# Patient Record
Sex: Male | Born: 1970
Health system: Southern US, Community
[De-identification: ages and names within clinical notes are randomized; demographics above are authoritative.]

## PROBLEM LIST (undated history)

## (undated) DIAGNOSIS — E7800 Pure hypercholesterolemia, unspecified: Secondary | ICD-10-CM

## (undated) DIAGNOSIS — I10 Essential (primary) hypertension: Secondary | ICD-10-CM

## (undated) HISTORY — PX: NO PAST SURGERIES: SHX2092

---

## 2001-09-15 ENCOUNTER — Emergency Department (HOSPITAL_COMMUNITY): Admission: EM | Admit: 2001-09-15 | Discharge: 2001-09-16 | Payer: Self-pay | Admitting: Emergency Medicine

## 2001-09-16 ENCOUNTER — Encounter: Payer: Self-pay | Admitting: Emergency Medicine

## 2003-03-04 ENCOUNTER — Emergency Department (HOSPITAL_COMMUNITY): Admission: EM | Admit: 2003-03-04 | Discharge: 2003-03-04 | Payer: Self-pay | Admitting: Emergency Medicine

## 2007-08-20 ENCOUNTER — Emergency Department (HOSPITAL_COMMUNITY): Admission: EM | Admit: 2007-08-20 | Discharge: 2007-08-20 | Payer: Self-pay | Admitting: Emergency Medicine

## 2011-11-30 ENCOUNTER — Other Ambulatory Visit (HOSPITAL_COMMUNITY)
Admission: RE | Admit: 2011-11-30 | Discharge: 2011-11-30 | Disposition: A | Discharge status: Home / Self Care | Payer: Managed Care, Other (non HMO) | Source: Ambulatory Visit | Attending: Family Medicine | Admitting: Family Medicine

## 2011-11-30 ENCOUNTER — Ambulatory Visit (INDEPENDENT_AMBULATORY_CARE_PROVIDER_SITE_OTHER): Payer: Managed Care, Other (non HMO) | Admitting: Family Medicine

## 2011-11-30 ENCOUNTER — Encounter: Payer: Self-pay | Admitting: Family Medicine

## 2011-11-30 VITALS — BP 142/80 | HR 68 | Resp 18 | Ht 69.0 in | Wt 181.0 lb

## 2011-11-30 DIAGNOSIS — Z113 Encounter for screening for infections with a predominantly sexual mode of transmission: Secondary | ICD-10-CM | POA: Insufficient documentation

## 2011-11-30 DIAGNOSIS — IMO0001 Reserved for inherently not codable concepts without codable children: Secondary | ICD-10-CM

## 2011-11-30 DIAGNOSIS — F17200 Nicotine dependence, unspecified, uncomplicated: Secondary | ICD-10-CM

## 2011-11-30 DIAGNOSIS — R030 Elevated blood-pressure reading, without diagnosis of hypertension: Secondary | ICD-10-CM

## 2011-11-30 DIAGNOSIS — Z720 Tobacco use: Secondary | ICD-10-CM

## 2011-11-30 DIAGNOSIS — N50819 Testicular pain, unspecified: Secondary | ICD-10-CM | POA: Insufficient documentation

## 2011-11-30 DIAGNOSIS — N509 Disorder of male genital organs, unspecified: Secondary | ICD-10-CM

## 2011-11-30 DIAGNOSIS — Z13220 Encounter for screening for lipoid disorders: Secondary | ICD-10-CM

## 2011-11-30 DIAGNOSIS — Z833 Family history of diabetes mellitus: Secondary | ICD-10-CM

## 2011-11-30 LAB — CBC
HCT: 43 % (ref 39.0–52.0)
Hemoglobin: 14.5 g/dL (ref 13.0–17.0)
MCH: 28.1 pg (ref 26.0–34.0)
MCHC: 33.7 g/dL (ref 30.0–36.0)
MCV: 83.3 fL (ref 78.0–100.0)
Platelets: 224 10*3/uL (ref 150–400)
RBC: 5.16 MIL/uL (ref 4.22–5.81)
RDW: 12.4 % (ref 11.5–15.5)
WBC: 4.1 10*3/uL (ref 4.0–10.5)

## 2011-11-30 LAB — COMPREHENSIVE METABOLIC PANEL
ALT: 15 U/L (ref 0–53)
AST: 18 U/L (ref 0–37)
Albumin: 4.5 g/dL (ref 3.5–5.2)
Alkaline Phosphatase: 60 U/L (ref 39–117)
BUN: 15 mg/dL (ref 6–23)
CO2: 31 mEq/L (ref 19–32)
Calcium: 9.5 mg/dL (ref 8.4–10.5)
Chloride: 104 mEq/L (ref 96–112)
Creat: 1.05 mg/dL (ref 0.50–1.35)
Glucose, Bld: 100 mg/dL — ABNORMAL HIGH (ref 70–99)
Potassium: 4.4 mEq/L (ref 3.5–5.3)
Sodium: 142 mEq/L (ref 135–145)
Total Bilirubin: 0.4 mg/dL (ref 0.3–1.2)
Total Protein: 7.3 g/dL (ref 6.0–8.3)

## 2011-11-30 LAB — POCT URINALYSIS DIPSTICK
Bilirubin, UA: NEGATIVE
Blood, UA: NEGATIVE
Glucose, UA: NEGATIVE
Ketones, UA: NEGATIVE
Nitrite, UA: NEGATIVE
Protein, UA: NEGATIVE
Spec Grav, UA: >=1.03
Urobilinogen, UA: 0.2
pH, UA: 6

## 2011-11-30 LAB — LIPID PANEL
Cholesterol: 216 mg/dL — ABNORMAL HIGH (ref 0–200)
HDL: 54 mg/dL (ref >39)
LDL Cholesterol: 136 mg/dL — ABNORMAL HIGH (ref 0–99)
Total CHOL/HDL Ratio: 4 Ratio
Triglycerides: 132 mg/dL (ref <150)
VLDL: 26 mg/dL (ref 0–40)

## 2011-11-30 MED ORDER — DOXYCYCLINE HYCLATE 100 MG PO TABS: 100.0000 mg | ORAL_TABLET | Freq: Two times a day (BID) | ORAL | Status: AC

## 2011-11-30 MED ORDER — CEFTRIAXONE SODIUM 1 G IJ SOLR
250.0000 mg | Freq: Once | INTRAMUSCULAR | Status: AC
  Administered 2011-11-30: 250 mg via INTRAMUSCULAR

## 2011-11-30 NOTE — Patient Instructions (Signed)
Get the labs done today  Start the antibiotics- take 1 tablet twice a day F/U 4 weeks

## 2011-11-30 NOTE — Progress Notes (Signed)
  Subjective:    Patient ID: Xavier Bauer, male    DOB: 1970-10-11, 41 y.o.   MRN: [id number]  HPI  Pt here to establish care, no previous PCP  History reviewed, no medication Concern today for recurrent scrotal pain. He's had pain on and off in his left testicle for the past 4 months. He is divorced and has no current partner. He had this previously 3 years ago was treated with antibiotics and resolved. He denies any burning sensation with urination denies blood in the urine denies abdominal pain. He denies penile discharge.   Review of Systems   GEN- denies fatigue, fever, weight loss,weakness, recent illness HEENT- denies eye drainage, change in vision, nasal discharge, CVS- denies chest pain, palpitations RESP- denies SOB, cough, wheeze ABD- denies N/V, change in stools, abd pain GU- denies dysuria, hematuria, dribbling, incontinence MSK- denies joint pain, muscle aches, injury Neuro- denies headache, dizziness, syncope, seizure activity      Objective:   Physical Exam GEN- NAD, alert and oriented x3 HEENT- PERRL, EOMI, non injected sclera, pink conjunctiva, MMM, oropharynx clear Neck- Supple, no thryomegaly CVS- RRR, no murmur RESP-CTAB ABD-NABS,soft,NT,ND GU- testes descened bilat, no hernia, no swelling, TTP left posterior testes , no nodules felt  EXT- No edema Pulses- Radial, DP- 2+        Assessment & Plan:

## 2011-12-01 LAB — HEMOGLOBIN A1C
Hgb A1c MFr Bld: 5.1 % (ref <5.7)
Mean Plasma Glucose: 100 mg/dL (ref <117)

## 2011-12-02 LAB — URINE CULTURE
Colony Count: NO GROWTH
Organism ID, Bacteria: NO GROWTH

## 2011-12-03 NOTE — Assessment & Plan Note (Signed)
Counseled on smoking cessation  

## 2011-12-03 NOTE — Assessment & Plan Note (Signed)
Will monitor blood pressure, no medications to be started at this visit

## 2011-12-03 NOTE — Assessment & Plan Note (Addendum)
We'll treat for possible epididymitis. Urine culture and urinalysis obtain urine GC will also be done. He was given a shot of Rocephin and will be started on doxycycline. If this does not improve I will obtain ultrasound of the testes

## 2012-03-31 ENCOUNTER — Other Ambulatory Visit (HOSPITAL_COMMUNITY)
Admission: RE | Admit: 2012-03-31 | Discharge: 2012-03-31 | Disposition: A | Discharge status: Home / Self Care | Payer: Managed Care, Other (non HMO) | Source: Ambulatory Visit | Attending: Family Medicine | Admitting: Family Medicine

## 2012-03-31 ENCOUNTER — Encounter: Payer: Self-pay | Admitting: Family Medicine

## 2012-03-31 ENCOUNTER — Other Ambulatory Visit: Payer: Self-pay | Admitting: Family Medicine

## 2012-03-31 ENCOUNTER — Ambulatory Visit (INDEPENDENT_AMBULATORY_CARE_PROVIDER_SITE_OTHER): Payer: Managed Care, Other (non HMO) | Admitting: Family Medicine

## 2012-03-31 VITALS — BP 140/84 | HR 86 | Resp 18 | Ht 69.0 in | Wt 185.1 lb

## 2012-03-31 DIAGNOSIS — Z113 Encounter for screening for infections with a predominantly sexual mode of transmission: Secondary | ICD-10-CM | POA: Insufficient documentation

## 2012-03-31 DIAGNOSIS — IMO0001 Reserved for inherently not codable concepts without codable children: Secondary | ICD-10-CM

## 2012-03-31 DIAGNOSIS — E785 Hyperlipidemia, unspecified: Secondary | ICD-10-CM

## 2012-03-31 DIAGNOSIS — Z2089 Contact with and (suspected) exposure to other communicable diseases: Secondary | ICD-10-CM

## 2012-03-31 DIAGNOSIS — Z202 Contact with and (suspected) exposure to infections with a predominantly sexual mode of transmission: Secondary | ICD-10-CM | POA: Insufficient documentation

## 2012-03-31 DIAGNOSIS — R030 Elevated blood-pressure reading, without diagnosis of hypertension: Secondary | ICD-10-CM

## 2012-03-31 LAB — RPR

## 2012-03-31 LAB — HIV ANTIBODY (ROUTINE TESTING W REFLEX): HIV: NONREACTIVE

## 2012-03-31 LAB — HEPATITIS B SURFACE ANTIGEN: Hepatitis B Surface Ag: NEGATIVE

## 2012-03-31 NOTE — Progress Notes (Signed)
  Subjective:    Patient ID: Xavier Bauer, male    DOB: 03/22/1971, 41 y.o.   MRN: [id number]  HPI  Patient here secondary to exposure to STD. He was told that his partner has been exposed to herpes simplex type II, and he would like full STD check. He denies any rash ulcers or lesions on the genital region. He states he did have a cold sore a few weeks ago which is down scabbed over. His previous testicular pain or heat he was treated for an office has resolved. His previous labs were also reviewed at this visit  Review of Systems - per above   GEN- denies fatigue, fever, weight loss,weakness, recent illness HEENT- denies eye drainage, change in vision, nasal discharge, CVS- denies chest pain, palpitations RESP- denies SOB, cough, wheeze ABD- denies N/V, change in stools, abd pain GU- denies dysuria, hematuria, dribbling, incontinence Skin- no rash       Objective:   Physical Exam GEN- NAD, alert and oriented x3 HEENT- PERRL, EOMI, non injected sclera, pink conjunctiva, MMM, oropharynx clear, scab lower left lip Neck- Supple, no LAD CVS- RRR, no murmur RESP-CTAB EXT- No edema Pulses- Radial 2+        Assessment & Plan:

## 2012-03-31 NOTE — Assessment & Plan Note (Signed)
Work on diet and lifestyle changes, pt committed to doing so

## 2012-03-31 NOTE — Assessment & Plan Note (Signed)
Urine GC/Chlamydia, HSV , HIV, RPR, HepB to be done

## 2012-03-31 NOTE — Assessment & Plan Note (Signed)
Monitor BP, discussed dietary changes

## 2012-03-31 NOTE — Patient Instructions (Signed)
We will call with results  Watch the salt intake, watch the fried foods, low cholesterol F/U 6 months for physical

## 2012-04-02 ENCOUNTER — Telehealth: Payer: Self-pay | Admitting: Family Medicine

## 2012-04-02 NOTE — Telephone Encounter (Signed)
noted 

## 2012-04-02 NOTE — Telephone Encounter (Signed)
Labs pending.  HSV 1 and HSV 2 not collected.  Called and added additional labs.

## 2012-04-03 LAB — HSV(HERPES SIMPLEX VRS) I + II AB-IGG: HSV 1 Glycoprotein G Ab, IgG: 11.11 IV — ABNORMAL HIGH

## 2014-07-04 ENCOUNTER — Emergency Department (HOSPITAL_BASED_OUTPATIENT_CLINIC_OR_DEPARTMENT_OTHER)
Admission: EM | Admit: 2014-07-04 | Discharge: 2014-07-04 | Disposition: A | Discharge status: Home / Self Care | Payer: Managed Care, Other (non HMO) | Attending: Emergency Medicine | Admitting: Emergency Medicine

## 2014-07-04 ENCOUNTER — Encounter (HOSPITAL_BASED_OUTPATIENT_CLINIC_OR_DEPARTMENT_OTHER): Payer: Self-pay | Admitting: Emergency Medicine

## 2014-07-04 DIAGNOSIS — Z720 Tobacco use: Secondary | ICD-10-CM | POA: Insufficient documentation

## 2014-07-04 DIAGNOSIS — J111 Influenza due to unidentified influenza virus with other respiratory manifestations: Secondary | ICD-10-CM | POA: Insufficient documentation

## 2014-07-04 DIAGNOSIS — R69 Illness, unspecified: Secondary | ICD-10-CM

## 2014-07-04 DIAGNOSIS — R509 Fever, unspecified: Secondary | ICD-10-CM | POA: Diagnosis present

## 2014-07-04 LAB — CBG MONITORING, ED: GLUCOSE-CAPILLARY: 127 mg/dL — AB (ref 70–99)

## 2014-07-04 MED ORDER — ACETAMINOPHEN 325 MG PO TABS
650.0000 mg | ORAL_TABLET | Freq: Once | ORAL | Status: AC
  Administered 2014-07-04: 650 mg via ORAL
  Filled 2014-07-04: qty 2

## 2014-07-04 NOTE — ED Notes (Signed)
Pt states body aches and fever starting yesterday morning, sore throat; fever and chills throughout night. Nauseated but denies V/D. Has been taking Alkaseltzer Cold at home with some relief but still feels bad.

## 2014-07-04 NOTE — Discharge Instructions (Signed)

## 2014-07-04 NOTE — ED Provider Notes (Signed)
CSN: [id number]     Arrival date & time 07/04/14  1434 History  This chart was scribed for Margarita Grizzle, MD by Abel Presto, ED Scribe. This patient was seen in room MH06/MH06 and the patient's care was started at 4:13 PM.    Chief Complaint  Patient presents with  . Fever     Patient is a 44 y.o. male presenting with fever. The history is provided by the patient. No language interpreter was used.  Fever Temp source:  Subjective Severity:  Mild Onset quality:  Sudden Duration:  2 days Timing:  Constant Progression:  Unchanged Chronicity:  New Relieved by:  Nothing Worsened by:  Nothing tried Associated symptoms: chills, cough, nausea and sore throat   Associated symptoms: no diarrhea and no vomiting   Cough:    Cough characteristics:  Non-productive   Severity:  Mild   Onset quality:  Sudden   Duration:  2 days   Timing:  Sporadic   Progression:  Unchanged   Chronicity:  New Nausea:    Severity:  Unable to specify Sore throat:    Severity:  Moderate   Onset quality:  Sudden   Duration:  2 weeks   Timing:  Constant   Progression:  Unchanged Risk factors: no sick contacts    HPI Comments: Xavier Bauer is a 44 y.o. male who presents to the Emergency Department complaining of subjective fever with onset yesterday. Pt notes associated sore throat, SOB, non productive cough chills, and nausea. Pt has taken Alkaseltzer Cold for relief. Pt denies vomiting, diarrhea, and vision changes. Pt is an occasional smoker. Pt denies recent sick contacts and did not get a flu shot this year.   History reviewed. No pertinent past medical history. History reviewed. No pertinent past surgical history. Family History  Problem Relation Age of Onset  . Hypertension Mother   . Hyperlipidemia Mother   . Hypertension Father   . Hyperlipidemia Father   . Diabetes Father   . Hypertension Brother    History  Substance Use Topics  . Smoking status: Current Some Day Smoker -- 0.00  packs/day    Types: Cigarettes  . Smokeless tobacco: Not on file  . Alcohol Use: Yes     Comment: last drink 3 weeks ago    Review of Systems  Constitutional: Positive for fever and chills.  HENT: Positive for sore throat.   Eyes: Negative for visual disturbance.  Respiratory: Positive for cough and shortness of breath.   Gastrointestinal: Positive for nausea. Negative for vomiting and diarrhea.  All other systems reviewed and are negative.     Allergies  Review of patient's allergies indicates no known allergies.  Home Medications   Prior to Admission medications   Not on File   BP 147/95 mmHg  Pulse 111  Temp(Src) 99.6 F (37.6 C) (Oral)  Resp 18  Ht  (1.753 m)  Wt 160 lb (72.576 kg)  BMI 23.62 kg/m2  SpO2 99% Physical Exam  Constitutional: He is oriented to person, place, and time. He appears well-developed and well-nourished.  HENT:  Head: Normocephalic.  Eyes: Conjunctivae are normal.  Neck: Normal range of motion. Neck supple.  Cardiovascular: Normal rate, regular rhythm and normal heart sounds.  Exam reveals no friction rub.   No murmur heard. Pulmonary/Chest: Effort normal and breath sounds normal. No respiratory distress. He has no wheezes. He has no rales.  Abdominal: Soft. There is no tenderness.  Musculoskeletal: Normal range of motion.  Neurological: He is  alert and oriented to person, place, and time.  Skin: Skin is warm and dry.  Psychiatric: He has a normal mood and affect. His behavior is normal.  Nursing note and vitals reviewed.   ED Course  Procedures (including critical care time) DIAGNOSTIC STUDIES: Oxygen Saturation is 99% on room air, normal by my interpretation.    COORDINATION OF CARE: 4:18 PM Discussed treatment plan with patient at beside, the patient agrees with the plan and has no further questions at this time.   Labs Review Labs Reviewed  CBG MONITORING, ED - Abnormal; Notable for the following:    Glucose-Capillary  127 (*)    All other components within normal limits  CBG MONITORING, ED    Imaging Review No results found.   EKG Interpretation None      MDM   Final diagnoses:  Influenza-like illness     I personally performed the services described in this documentation, which was scribed in my presence. The recorded information has been reviewed and considered.      Margarita Grizzleanielle Catori Panozzo, MD 07/07/14 [phone]

## 2016-09-07 ENCOUNTER — Ambulatory Visit (INDEPENDENT_AMBULATORY_CARE_PROVIDER_SITE_OTHER): Payer: 59 | Admitting: Urology

## 2016-09-07 DIAGNOSIS — Z302 Encounter for sterilization: Secondary | ICD-10-CM | POA: Diagnosis not present

## 2017-03-09 ENCOUNTER — Encounter (HOSPITAL_COMMUNITY): Payer: Self-pay | Admitting: Emergency Medicine

## 2017-03-09 ENCOUNTER — Emergency Department (HOSPITAL_COMMUNITY)
Admission: EM | Admit: 2017-03-09 | Discharge: 2017-03-09 | Disposition: A | Discharge status: Home / Self Care | Payer: 59 | Attending: Emergency Medicine | Admitting: Emergency Medicine

## 2017-03-09 ENCOUNTER — Other Ambulatory Visit: Payer: Self-pay

## 2017-03-09 ENCOUNTER — Emergency Department (HOSPITAL_COMMUNITY): Payer: 59

## 2017-03-09 DIAGNOSIS — R1084 Generalized abdominal pain: Secondary | ICD-10-CM | POA: Insufficient documentation

## 2017-03-09 DIAGNOSIS — M545 Low back pain, unspecified: Secondary | ICD-10-CM

## 2017-03-09 DIAGNOSIS — R1030 Lower abdominal pain, unspecified: Secondary | ICD-10-CM | POA: Diagnosis not present

## 2017-03-09 DIAGNOSIS — F17210 Nicotine dependence, cigarettes, uncomplicated: Secondary | ICD-10-CM | POA: Diagnosis not present

## 2017-03-09 LAB — CBC WITH DIFFERENTIAL/PLATELET
Basophils Absolute: 0 10*3/uL (ref 0.0–0.1)
Basophils Relative: 1 %
EOS ABS: 0.1 10*3/uL (ref 0.0–0.7)
Eosinophils Relative: 3 %
HEMATOCRIT: 49.1 % (ref 39.0–52.0)
Hemoglobin: 15.5 g/dL (ref 13.0–17.0)
LYMPHS ABS: 1.4 10*3/uL (ref 0.7–4.0)
Lymphocytes Relative: 33 %
MCH: 28.3 pg (ref 26.0–34.0)
MCHC: 31.6 g/dL (ref 30.0–36.0)
MCV: 89.8 fL (ref 78.0–100.0)
MONOS PCT: 8 %
Monocytes Absolute: 0.4 10*3/uL (ref 0.1–1.0)
Neutro Abs: 2.3 10*3/uL (ref 1.7–7.7)
Neutrophils Relative %: 55 %
Platelets: 230 10*3/uL (ref 150–400)
RBC: 5.47 MIL/uL (ref 4.22–5.81)
RDW: 12.4 % (ref 11.5–15.5)
WBC: 4.2 10*3/uL (ref 4.0–10.5)

## 2017-03-09 LAB — COMPREHENSIVE METABOLIC PANEL
ALK PHOS: 62 U/L (ref 38–126)
ALT: 22 U/L (ref 17–63)
ANION GAP: 8 (ref 5–15)
AST: 26 U/L (ref 15–41)
Albumin: 4.4 g/dL (ref 3.5–5.0)
BUN: 15 mg/dL (ref 6–20)
CALCIUM: 9.8 mg/dL (ref 8.9–10.3)
CO2: 30 mmol/L (ref 22–32)
CREATININE: 1.33 mg/dL — AB (ref 0.61–1.24)
Chloride: 100 mmol/L — ABNORMAL LOW (ref 101–111)
GFR calc non Af Amer: >60 mL/min (ref >60)
GLUCOSE: 132 mg/dL — AB (ref 65–99)
Potassium: 4.3 mmol/L (ref 3.5–5.1)
Sodium: 138 mmol/L (ref 135–145)
Total Bilirubin: 0.5 mg/dL (ref 0.3–1.2)
Total Protein: 8 g/dL (ref 6.5–8.1)

## 2017-03-09 LAB — URINALYSIS, ROUTINE W REFLEX MICROSCOPIC
BILIRUBIN URINE: NEGATIVE
GLUCOSE, UA: NEGATIVE mg/dL
HGB URINE DIPSTICK: NEGATIVE
Ketones, ur: NEGATIVE mg/dL
Leukocytes, UA: NEGATIVE
Nitrite: NEGATIVE
Protein, ur: NEGATIVE mg/dL
Specific Gravity, Urine: 1.018 (ref 1.005–1.030)
pH: 6 (ref 5.0–8.0)

## 2017-03-09 LAB — LIPASE, BLOOD: Lipase: 23 U/L (ref 11–51)

## 2017-03-09 MED ORDER — HYDROCODONE-ACETAMINOPHEN 5-325 MG PO TABS: ORAL_TABLET | ORAL | 0 refills | Status: DC

## 2017-03-09 MED ORDER — ONDANSETRON 8 MG PO TBDP
8.0000 mg | ORAL_TABLET | Freq: Once | ORAL | Status: AC
  Administered 2017-03-09: 8 mg via ORAL
  Filled 2017-03-09: qty 1

## 2017-03-09 MED ORDER — METHOCARBAMOL 500 MG PO TABS: 1000.0000 mg | ORAL_TABLET | Freq: Four times a day (QID) | ORAL | 0 refills | Status: DC | PRN

## 2017-03-09 MED ORDER — MORPHINE SULFATE (PF) 4 MG/ML IV SOLN
4.0000 mg | Freq: Once | INTRAVENOUS | Status: AC
  Administered 2017-03-09: 4 mg via INTRAMUSCULAR
  Filled 2017-03-09: qty 1

## 2017-03-09 NOTE — ED Provider Notes (Signed)
[facility] EMERGENCY DEPARTMENT Provider Note   CSN: [id number] Arrival date & time: 03/09/17  1200     History   Chief Complaint Chief Complaint  Patient presents with  . Back Pain    HPI Xavier Bauer is a 46 y.o. male.  HPI Pt was seen at 1215. Per pt, c/o gradual onset and persistence of constant low back "pain" that began overnight last night. Pain worsens with certain body positions. Has been associated with nausea and generalized abd "pain." Denies incont/retention of bowel or bladder, no saddle anesthesia, no focal motor weakness, no tingling/numbness in extremities, no fevers, no injury, no vomiting/diarrhea, no CP/SOB, no dysuria/hematuria, no testicular pain/swelling.       History reviewed. No pertinent past medical history.  Patient Active Problem List   Diagnosis Date Noted  . Exposure to STD 03/31/2012  . Mild hyperlipidemia 03/31/2012  . Testicular pain 11/30/2011  . Elevated blood pressure 11/30/2011  . Tobacco user 11/30/2011    History reviewed. No pertinent surgical history.     Home Medications    Prior to Admission medications   Not on File    Family History Family History  Problem Relation Age of Onset  . Hypertension Mother   . Hyperlipidemia Mother   . Hypertension Father   . Hyperlipidemia Father   . Diabetes Father   . Hypertension Brother     Social History Social History   Tobacco Use  . Smoking status: Current Some Day Smoker    Packs/day: 0.00    Types: Cigarettes  . Smokeless tobacco: Never Used  Substance Use Topics  . Alcohol use: Yes    Comment: weekly   . Drug use: No     Allergies   Patient has no known allergies.   Review of Systems Review of Systems ROS: Statement: All systems negative except as marked or noted in the HPI; Constitutional: Negative for fever and chills. ; ; Eyes: Negative for eye pain, redness and discharge. ; ; ENMT: Negative for ear pain, hoarseness, nasal congestion, sinus  pressure and sore throat. ; ; Cardiovascular: Negative for chest pain, palpitations, diaphoresis, dyspnea and peripheral edema. ; ; Respiratory: Negative for cough, wheezing and stridor. ; ; Gastrointestinal: +nausea, abd pain. Negative for vomiting, diarrhea, blood in stool, hematemesis, jaundice and rectal bleeding. . ; ; Genitourinary: Negative for dysuria, flank pain and hematuria. ; ; Genital:  No penile drainage or rash, no testicular pain or swelling, no scrotal rash or swelling. ;; Musculoskeletal: +LBP. Negative for neck pain. Negative for swelling and trauma.; ; Skin: Negative for pruritus, rash, abrasions, blisters, bruising and skin lesion.; ; Neuro: Negative for headache, lightheadedness and neck stiffness. Negative for weakness, altered level of consciousness, altered mental status, extremity weakness, paresthesias, involuntary movement, seizure and syncope.       Physical Exam Updated Vital Signs Pulse (!) 105   Temp 98.4 F (36.9 C) (Oral)   Resp 19   Ht 5\' 9"  (1.753 m)   Wt 83.9 kg (185 lb)   SpO2 98%   BMI 27.32 kg/m   Physical Exam 1220: Physical examination:  Nursing notes reviewed; Vital signs and O2 SAT reviewed;  Constitutional: Well developed, Well nourished, Well hydrated, In no acute distress; Head:  Normocephalic, atraumatic; Eyes: EOMI, PERRL, No scleral icterus; ENMT: Mouth and pharynx normal, Mucous membranes moist; Neck: Supple, Full range of motion, No lymphadenopathy; Cardiovascular: Regular rate and rhythm, No gallop; Respiratory: Breath sounds clear & equal bilaterally, No wheezes.  Speaking  full sentences with ease, Normal respiratory effort/excursion; Chest: Nontender, Movement normal; Abdomen: Soft, Nontender, Nondistended, Normal bowel sounds; Genitourinary: No CVA tenderness; Spine:  No midline CS, TS, LS tenderness.;; Extremities: Pulses normal, No tenderness, No edema, No calf edema or asymmetry.; Neuro: AA&Ox3, Major CN grossly intact.  Speech clear. No  gross focal motor or sensory deficits in extremities. Climbs on and off stretcher easily by himself. Gait steady.; Skin: Color normal, Warm, Dry.   ED Treatments / Results  Labs (all labs ordered are listed, but only abnormal results are displayed)   EKG  EKG Interpretation None       Radiology   Procedures Procedures (including critical care time)  Medications Ordered in ED Medications  morphine 4 MG/ML injection 4 mg (4 mg Intramuscular Given 03/09/17 1224)  ondansetron (ZOFRAN-ODT) disintegrating tablet 8 mg (8 mg Oral Given 03/09/17 1224)     Initial Impression / Assessment and Plan / ED Course  I have reviewed the triage vital signs and the nursing notes.  Pertinent labs & imaging results that were available during my care of the patient were reviewed by me and considered in my medical decision making (see chart for details).  MDM Reviewed: previous chart, nursing note and vitals Reviewed previous: labs Interpretation: labs and CT scan   Results for orders placed or performed during the hospital encounter of 03/09/17  Urinalysis, Routine w reflex microscopic  Result Value Ref Range   Color, Urine YELLOW YELLOW   APPearance CLEAR CLEAR   Specific Gravity, Urine 1.018 1.005 - 1.030   pH 6.0 5.0 - 8.0   Glucose, UA NEGATIVE NEGATIVE mg/dL   Hgb urine dipstick NEGATIVE NEGATIVE   Bilirubin Urine NEGATIVE NEGATIVE   Ketones, ur NEGATIVE NEGATIVE mg/dL   Protein, ur NEGATIVE NEGATIVE mg/dL   Nitrite NEGATIVE NEGATIVE   Leukocytes, UA NEGATIVE NEGATIVE  Comprehensive metabolic panel  Result Value Ref Range   Sodium 138 135 - 145 mmol/L   Potassium 4.3 3.5 - 5.1 mmol/L   Chloride 100 (L) 101 - 111 mmol/L   CO2 30 22 - 32 mmol/L   Glucose, Bld 132 (H) 65 - 99 mg/dL   BUN 15 6 - 20 mg/dL   Creatinine, Ser 1.61 (H) 0.61 - 1.24 mg/dL   Calcium 9.8 8.9 - 09.6 mg/dL   Total Protein 8.0 6.5 - 8.1 g/dL   Albumin 4.4 3.5 - 5.0 g/dL   AST 26 15 - 41 U/L   ALT 22  17 - 63 U/L   Alkaline Phosphatase 62 38 - 126 U/L   Total Bilirubin 0.5 0.3 - 1.2 mg/dL   GFR calc non Af Amer >60 >60 mL/min   GFR calc Af Amer >60 >60 mL/min   Anion gap 8 5 - 15  Lipase, blood  Result Value Ref Range   Lipase 23 11 - 51 U/L  CBC with Differential  Result Value Ref Range   WBC 4.2 4.0 - 10.5 K/uL   RBC 5.47 4.22 - 5.81 MIL/uL   Hemoglobin 15.5 13.0 - 17.0 g/dL   HCT 04.5 40.9 - 81.1 %   MCV 89.8 78.0 - 100.0 fL   MCH 28.3 26.0 - 34.0 pg   MCHC 31.6 30.0 - 36.0 g/dL   RDW 91.4 78.2 - 95.6 %   Platelets 230 150 - 400 K/uL   Neutrophils Relative % 55 %   Neutro Abs 2.3 1.7 - 7.7 K/uL   Lymphocytes Relative 33 %   Lymphs Abs 1.4 0.7 -  4.0 K/uL   Monocytes Relative 8 %   Monocytes Absolute 0.4 0.1 - 1.0 K/uL   Eosinophils Relative 3 %   Eosinophils Absolute 0.1 0.0 - 0.7 K/uL   Basophils Relative 1 %   Basophils Absolute 0.0 0.0 - 0.1 K/uL   Ct Renal Stone Study Result Date: 03/09/2017 CLINICAL DATA:  46 year old male with history of low back pain and low anterior abdominal pressure since midnight. EXAM: CT ABDOMEN AND PELVIS WITHOUT CONTRAST TECHNIQUE: Multidetector CT imaging of the abdomen and pelvis was performed following the standard protocol without IV contrast. COMPARISON:  No priors. FINDINGS: Lower chest: Unremarkable. Hepatobiliary: No definite cystic or solid hepatic lesions are noted on today's noncontrast CT examination. Unenhanced appearance of the gallbladder is normal. Pancreas: No definite pancreatic mass or peripancreatic inflammatory changes are noted on today's noncontrast CT examination. Spleen: Unremarkable. Adrenals/Urinary Tract: There are no abnormal calcifications within the collecting system of either kidney, along the course of either ureter, or within the lumen of the urinary bladder. No hydroureteronephrosis or perinephric stranding to suggest urinary tract obstruction at this time. The unenhanced appearance of the right kidney is  unremarkable. 1.2 cm low-attenuation lesion in the upper pole the left kidney is incompletely characterized on today's noncontrast CT examination, but is statistically likely a tiny cyst. Unenhanced appearance of the urinary bladder is normal. Urinary bladder is normal in appearance. Stomach/Bowel: Unenhanced appearance of the stomach is normal. No pathologic dilatation of small bowel or colon. Normal appendix. Vascular/Lymphatic: No atherosclerotic calcifications in the abdominal or pelvic vasculature. No lymphadenopathy noted in the abdomen or pelvis. Reproductive: Prostate gland and seminal vesicles are unremarkable in appearance. Other: No significant volume of ascites.  No pneumoperitoneum. Musculoskeletal: There are no aggressive appearing lytic or blastic lesions noted in the visualized portions of the skeleton. IMPRESSION: 1. No acute findings are noted in the abdomen or pelvis to account for the patient's symptoms. Specifically, no urinary tract calculi and no findings to suggest urinary tract obstruction. 2. Normal appendix. Electronically Signed   By: Trudie Reedaniel  Entrikin M.D.   On: 03/09/2017 13:09     1320:  Workup reassuring. No vomiting or stooling while in the ED.  Abd remains benign, VSS. Feels better and wants to go home now. Tx symptomatically at this time. Dx and testing d/w pt and family.  Questions answered.  Verb understanding, agreeable to d/c home with outpt f/u.    Final Clinical Impressions(s) / ED Diagnoses   Final diagnoses:  None    ED Discharge Orders    None       Samuel JesterMcManus, Timberlyn Pickford, DO 03/13/17 1853

## 2017-03-09 NOTE — Discharge Instructions (Signed)
Take the prescriptions as directed.  Apply moist heat or ice to the area(s) of discomfort, for 15 minutes at a time, several times per day for the next few days.  Do not fall asleep on a heating or ice pack.  Your CT scan showed an incidental finding:  "1.2 cm low-attenuation lesion in the upper pole the left kidney is incompletely characterized on today's noncontrast CT examination, but is statistically likely a tiny cyst."  Your regular medical doctor can follow up this finding if needed. Call your regular medical doctor on Monday to schedule a follow up appointment within the next 3 days.  Return to the Emergency Department immediately sooner if worsening.

## 2017-03-09 NOTE — ED Triage Notes (Signed)
Low back pain and lower abd pain since midnight.  Denies urinary s/s.  Does report some nausea

## 2017-03-10 LAB — URINE CULTURE: Culture: NO GROWTH

## 2017-03-22 ENCOUNTER — Ambulatory Visit: Payer: Self-pay | Admitting: Family Medicine

## 2017-04-05 DIAGNOSIS — N451 Epididymitis: Secondary | ICD-10-CM | POA: Diagnosis not present

## 2017-06-28 ENCOUNTER — Ambulatory Visit (INDEPENDENT_AMBULATORY_CARE_PROVIDER_SITE_OTHER): Payer: 59 | Admitting: Family Medicine

## 2017-06-28 ENCOUNTER — Other Ambulatory Visit: Payer: Self-pay

## 2017-06-28 ENCOUNTER — Encounter: Payer: Self-pay | Admitting: Family Medicine

## 2017-06-28 VITALS — BP 138/84 | HR 70 | Temp 98.3°F | Resp 14 | Ht 69.0 in | Wt 192.0 lb

## 2017-06-28 DIAGNOSIS — E785 Hyperlipidemia, unspecified: Secondary | ICD-10-CM | POA: Diagnosis not present

## 2017-06-28 DIAGNOSIS — Z125 Encounter for screening for malignant neoplasm of prostate: Secondary | ICD-10-CM

## 2017-06-28 DIAGNOSIS — Z0000 Encounter for general adult medical examination without abnormal findings: Secondary | ICD-10-CM | POA: Diagnosis not present

## 2017-06-28 DIAGNOSIS — L309 Dermatitis, unspecified: Secondary | ICD-10-CM | POA: Diagnosis not present

## 2017-06-28 DIAGNOSIS — Z789 Other specified health status: Secondary | ICD-10-CM | POA: Diagnosis not present

## 2017-06-28 DIAGNOSIS — Z113 Encounter for screening for infections with a predominantly sexual mode of transmission: Secondary | ICD-10-CM | POA: Diagnosis not present

## 2017-06-28 DIAGNOSIS — F1090 Alcohol use, unspecified, uncomplicated: Secondary | ICD-10-CM

## 2017-06-28 DIAGNOSIS — N50819 Testicular pain, unspecified: Secondary | ICD-10-CM

## 2017-06-28 DIAGNOSIS — Z7289 Other problems related to lifestyle: Secondary | ICD-10-CM | POA: Insufficient documentation

## 2017-06-28 DIAGNOSIS — R7309 Other abnormal glucose: Secondary | ICD-10-CM | POA: Diagnosis not present

## 2017-06-28 LAB — URINALYSIS, ROUTINE W REFLEX MICROSCOPIC
Bilirubin Urine: NEGATIVE
GLUCOSE, UA: NEGATIVE
HGB URINE DIPSTICK: NEGATIVE
KETONES UR: NEGATIVE
Leukocytes, UA: NEGATIVE
Nitrite: NEGATIVE
PROTEIN: NEGATIVE
Specific Gravity, Urine: 1.025 (ref 1.001–1.03)
pH: 6 (ref 5.0–8.0)

## 2017-06-28 MED ORDER — CLOTRIMAZOLE-BETAMETHASONE 1-0.05 % EX CREA: 1.0000 "application " | TOPICAL_CREAM | Freq: Two times a day (BID) | CUTANEOUS | 0 refills | Status: DC

## 2017-06-28 NOTE — Assessment & Plan Note (Signed)
Discussed cutting back his ETOH, avoid using as stress reliever  He was quite open about his use and is actively trying to cut it down, staes he has not had anything in 1 week, often can go a few months without ETOH

## 2017-06-28 NOTE — Assessment & Plan Note (Signed)
Check lipids 

## 2017-06-28 NOTE — Assessment & Plan Note (Signed)
Check PSA, check STD Send for testicular [place]

## 2017-06-28 NOTE — Progress Notes (Signed)
Subjective:    Patient ID: Xavier Bauer, male    DOB: [birth year], 47 y.o.   MRN: [id number]  Patient presents for Re-Establish Care (is fasting); L Shoulder Irritation (possible ringworm to L shoulder blade); and Testicular Discomfort (was treated at [facility] for prostatitis- still having some pain)   Pt here to establish care, was last seen in 2013, no PCP since he last saw me had testicular pain at that time as well, has actually had on and off for years  Was treated back in December for prostatitis by UC  Given antibiotic and something for inflammation  But he continues to have pain to left testicle randomly, no change with intercourse or urinating, no blood in urine   Divorced, last sexual actively 6 months    Has spot on left shoulder very itchy for past 2 weeks, concern for ringworm, friends son had ringworm , has not tried any creams    TDAP given 4 years ago , declines flu shot    Quit smoking 1 year ago , he does binge drink at times, only beer, that his job is closing beginning of next year.  His daughter has moved back in with him who is 68 he is encouraging her to go back to school.   Family and medical history reviewed      Review Of Systems:  GEN- denies fatigue, fever, weight loss,weakness, recent illness HEENT- denies eye drainage, change in vision, nasal discharge, CVS- denies chest pain, palpitations RESP- denies SOB, cough, wheeze ABD- denies N/V, change in stools, abd pain GU- denies dysuria, hematuria, dribbling, incontinence MSK- denies joint pain, muscle aches, injury Neuro- denies headache, dizziness, syncope, seizure activity       Objective:    BP 138/84   Pulse 70   Temp 98.3 F (36.8 C) (Oral)   Resp 14   Ht 5\' 9"  (1.753 m)   Wt 192 lb (87.1 kg)   SpO2 98%   BMI 28.35 kg/m  GEN- NAD, alert and oriented x3 HEENT- PERRL, EOMI, non injected sclera, pink conjunctiva, MMM, oropharynx clear Neck- Supple, no thyromegaly CVS- RRR, no  murmur RESP-CTAB ABD-NABS,soft,NT,ND GU- normal penile appearance, no hernia, testes descended bilat, TTP around left testes no discrete mass, no fluid /edema  Psych- normal affect and mood  Skin- circular patch mostly scabs and few macupapular lesions, flesh toned, some hyperpigmented scarring across back, +exocoriations  EXT- No edema Pulses- Radial, DP- 2+        Assessment & Plan:      Problem List Items Addressed This Visit      Unprioritized   Testicular pain    Check PSA, check STD Send for testicular US      Relevant Orders   Urinalysis, Routine w reflex microscopic (Completed)   C. trachomatis/N. gonorrhoeae RNA   Urine Culture   Mild hyperlipidemia    Check lipids      Alcohol use    Discussed cutting back his ETOH, avoid using as stress reliever  He was quite open about his use and is actively trying to cut it down, staes he has not had anything in 1 week, often can go a few months without ETOH       Other Visit Diagnoses    Routine general medical examination at a health care facility    -  Primary   CPE done, declines flu shot,fasting labs,continue dieetary changes, he has been monitoring BP, also exercising, schedule with eye doctor/dentist  Relevant Orders   CBC with Differential/Platelet   Comprehensive metabolic panel   Lipid panel   Screen for STD (sexually transmitted disease)       Relevant Orders   HIV antibody   C. trachomatis/N. gonorrhoeae RNA   Hepatitis C antibody   RPR   HSV(herpes simplex vrs) 1+2 ab-IgG   Prostate cancer screening       Relevant Orders   PSA   Dermatitis       non specific area of dermatitis, he states ringworm contact, given lotrisone for area      Note: This dictation was prepared with Dragon dictation along with smaller phrase technology. Any transcriptional errors that result from this process are unintentional.

## 2017-06-28 NOTE — Patient Instructions (Signed)
Ultrasound to be done I recommend eye visit once a year I recommend dental visit every 6 months Goal is to  Exercise 30 minutes 5 days a week We will call with lab results  F/U 6 months

## 2017-06-29 LAB — C. TRACHOMATIS/N. GONORRHOEAE RNA
C. trachomatis RNA, TMA: NOT DETECTED
N. gonorrhoeae RNA, TMA: NOT DETECTED

## 2017-06-29 LAB — URINE CULTURE
MICRO NUMBER:: 90331386
Result:: NO GROWTH
SPECIMEN QUALITY: ADEQUATE

## 2017-07-02 LAB — CBC WITH DIFFERENTIAL/PLATELET
Basophils Absolute: 49 cells/uL (ref 0–200)
Basophils Relative: 1.3 %
Eosinophils Absolute: 179 cells/uL (ref 15–500)
Eosinophils Relative: 4.7 %
HEMATOCRIT: 43.3 % (ref 38.5–50.0)
Hemoglobin: 14.2 g/dL (ref 13.2–17.1)
Lymphs Abs: 1448 cells/uL (ref 850–3900)
MCH: 27.7 pg (ref 27.0–33.0)
MCHC: 32.8 g/dL (ref 32.0–36.0)
MCV: 84.4 fL (ref 80.0–100.0)
MPV: 10.4 fL (ref 7.5–12.5)
Monocytes Relative: 11.5 %
NEUTROS PCT: 44.4 %
Neutro Abs: 1687 cells/uL (ref 1500–7800)
PLATELETS: 229 10*3/uL (ref 140–400)
RBC: 5.13 10*6/uL (ref 4.20–5.80)
RDW: 12.1 % (ref 11.0–15.0)
TOTAL LYMPHOCYTE: 38.1 %
WBC mixed population: 437 cells/uL (ref 200–950)
WBC: 3.8 10*3/uL (ref 3.8–10.8)

## 2017-07-02 LAB — COMPREHENSIVE METABOLIC PANEL
AG RATIO: 1.5 (calc) (ref 1.0–2.5)
ALT: 18 U/L (ref 9–46)
AST: 21 U/L (ref 10–40)
Albumin: 4.3 g/dL (ref 3.6–5.1)
Alkaline phosphatase (APISO): 51 U/L (ref 40–115)
BUN: 18 mg/dL (ref 7–25)
CO2: 25 mmol/L (ref 20–32)
Calcium: 9.5 mg/dL (ref 8.6–10.3)
Chloride: 105 mmol/L (ref 98–110)
Creat: 1.23 mg/dL (ref 0.60–1.35)
Globulin: 2.9 g/dL (calc) (ref 1.9–3.7)
Glucose, Bld: 113 mg/dL — ABNORMAL HIGH (ref 65–99)
Potassium: 4.4 mmol/L (ref 3.5–5.3)
Sodium: 142 mmol/L (ref 135–146)
Total Bilirubin: 0.4 mg/dL (ref 0.2–1.2)
Total Protein: 7.2 g/dL (ref 6.1–8.1)

## 2017-07-02 LAB — TEST AUTHORIZATION

## 2017-07-02 LAB — HIV ANTIBODY (ROUTINE TESTING W REFLEX): HIV 1&2 Ab, 4th Generation: NONREACTIVE

## 2017-07-02 LAB — PSA: PSA: 0.8 ng/mL (ref <=4.0)

## 2017-07-02 LAB — RPR: RPR Ser Ql: NONREACTIVE

## 2017-07-02 LAB — HEPATITIS C ANTIBODY
Hepatitis C Ab: NONREACTIVE
SIGNAL TO CUT-OFF: 0.01 (ref <1.00)

## 2017-07-02 LAB — HEMOGLOBIN A1C W/OUT EAG: HEMOGLOBIN A1C: 5 %{Hb} (ref <5.7)

## 2017-07-02 LAB — LIPID PANEL
CHOLESTEROL: 274 mg/dL — AB (ref <200)
HDL: 53 mg/dL (ref >40)
LDL CHOLESTEROL (CALC): 180 mg/dL — AB
Non-HDL Cholesterol (Calc): 221 mg/dL (calc) — ABNORMAL HIGH (ref <130)
Total CHOL/HDL Ratio: 5.2 (calc) — ABNORMAL HIGH (ref <5.0)
Triglycerides: 216 mg/dL — ABNORMAL HIGH (ref <150)

## 2017-07-02 LAB — HSV(HERPES SIMPLEX VRS) I + II AB-IGG: HAV 1 IGG,TYPE SPECIFIC AB: 38.6 index — ABNORMAL HIGH

## 2017-07-05 ENCOUNTER — Encounter: Payer: Self-pay | Admitting: Family Medicine

## 2017-07-05 ENCOUNTER — Other Ambulatory Visit: Payer: Self-pay

## 2017-07-05 ENCOUNTER — Ambulatory Visit (INDEPENDENT_AMBULATORY_CARE_PROVIDER_SITE_OTHER): Payer: 59 | Admitting: Family Medicine

## 2017-07-05 VITALS — BP 136/82 | HR 68 | Temp 98.6°F | Resp 14 | Ht 69.0 in | Wt 193.0 lb

## 2017-07-05 DIAGNOSIS — E782 Mixed hyperlipidemia: Secondary | ICD-10-CM | POA: Diagnosis not present

## 2017-07-05 DIAGNOSIS — B009 Herpesviral infection, unspecified: Secondary | ICD-10-CM

## 2017-07-05 NOTE — Assessment & Plan Note (Signed)
Reiterated dietary measures.  He was given a handout on foods to eatAnd those to avoid.  Also discussed the importance of cutting out the alcohol is also contributes to the high cholesterol.  He is to have his labs rechecked in 3 months if there is no significant improvement he will start statin drug.  We discussed the risk factors of hyperlipidemia.

## 2017-07-05 NOTE — Assessment & Plan Note (Signed)
HSV infection but not called in the significant problems.  We will just treat as needed with topicals.

## 2017-07-05 NOTE — Patient Instructions (Addendum)
F/U 3 months Lab visit - Cholesterol  Food Choices to Lower Your Triglycerides Triglycerides are a type of fat in your blood. High levels of triglycerides can increase the risk of heart disease and stroke. If your triglyceride levels are high, the foods you eat and your eating habits are very important. Choosing the right foods can help lower your triglycerides. What general guidelines do I need to follow?  Lose weight if you are overweight.  Limit or avoid alcohol.  Fill one half of your plate with vegetables and green salads.  Limit fruit to two servings a day. Choose fruit instead of juice.  Make one fourth of your plate whole grains. Look for the word "whole" as the first word in the ingredient list.  Fill one fourth of your plate with lean protein foods.  Enjoy fatty fish (such as salmon, mackerel, sardines, and tuna) three times a week.  Choose healthy fats.  Limit foods high in starch and sugar.  Eat more home-cooked food and less restaurant, buffet, and fast food.  Limit fried foods.  Cook foods using methods other than frying.  Limit saturated fats.  Check ingredient lists to avoid foods with partially hydrogenated oils (trans fats) in them. What foods can I eat? Grains Whole grains, such as whole wheat or whole grain breads, crackers, cereals, and pasta. Unsweetened oatmeal, bulgur, barley, quinoa, or brown rice. Corn or whole wheat flour tortillas. Vegetables Fresh or frozen vegetables (raw, steamed, roasted, or grilled). Green salads. Fruits All fresh, canned (in natural juice), or frozen fruits. Meat and Other Protein Products Ground beef (85% or leaner), grass-fed beef, or beef trimmed of fat. Skinless chicken or [place]. Ground chicken or [place]. Pork trimmed of fat. All fish and seafood. Eggs. Dried beans, peas, or lentils. Unsalted nuts or seeds. Unsalted canned or dry beans. Dairy Low-fat dairy products, such as skim or 1% milk, 2% or reduced-fat cheeses,  low-fat ricotta or cottage cheese, or plain low-fat yogurt. Fats and Oils Tub margarines without trans fats. Light or reduced-fat mayonnaise and salad dressings. Avocado. Safflower, olive, or canola oils. Natural peanut or almond butter. The items listed above may not be a complete list of recommended foods or beverages. Contact your dietitian for more options. What foods are not recommended? Grains White bread. White pasta. White rice. Cornbread. Bagels, pastries, and croissants. Crackers that contain trans fat. Vegetables White potatoes. Corn. Creamed or fried vegetables. Vegetables in a cheese sauce. Fruits Dried fruits. Canned fruit in light or heavy syrup. Fruit juice. Meat and Other Protein Products Fatty cuts of meat. Ribs, chicken wings, bacon, sausage, bologna, salami, chitterlings, fatback, hot dogs, bratwurst, and packaged luncheon meats. Dairy Whole or 2% milk, cream, half-and-half, and cream cheese. Whole-fat or sweetened yogurt. Full-fat cheeses. Nondairy creamers and whipped toppings. Processed cheese, cheese spreads, or cheese curds. Sweets and Desserts Corn syrup, sugars, honey, and molasses. Candy. Jam and jelly. Syrup. Sweetened cereals. Cookies, pies, cakes, donuts, muffins, and ice cream. Fats and Oils Butter, stick margarine, lard, shortening, ghee, or bacon fat. Coconut, palm kernel, or palm oils. Beverages Alcohol. Sweetened drinks (such as sodas, lemonade, and fruit drinks or punches). The items listed above may not be a complete list of foods and beverages to avoid. Contact your dietitian for more information. This information is not intended to replace advice given to you by your health care provider. Make sure you discuss any questions you have with your health care provider. Document Released: 01/19/2004 Document Revised: 09/08/2015 Document Reviewed: 02/04/2013  Elsevier Interactive Patient Education  2017 Elsevier Inc.  

## 2017-07-05 NOTE — Progress Notes (Signed)
   Subjective:    Patient ID: Xavier Bauer, male    DOB: March 25, 1971, 47 y.o.   MRN: [id number]  Patient presents for Follow-up (labs) Pt here to f/u labs, he recently established care He has no new concerns today.  Labs reviewed in detail.  For his testicular pain he has an ultrasound scheduled for next Friday.  His urine culture STD check for gonorrhea chlamydia trichomonas was negative.  He was positive for HSV type I he admits that he has had cold sores does not had any a few years.  He had a elevated fasting glucose however A1c returned normal at 5%.  His cholesterol is extremely elevated with an LDL of 180 total cholesterol 274 triglycerides 216 he has family history of hyperlipidemia  He admits that he has not been eating very well he tries but often picks up fast food.  His mother is currently in the hospital he has some stressors with his daughter.  But he is trying to get back on track    Review Of Systems:  GEN- denies fatigue, fever, weight loss,weakness, recent illness HEENT- denies eye drainage, change in vision, nasal discharge, CVS- denies chest pain, palpitations RESP- denies SOB, cough, wheeze ABD- denies N/V, change in stools, abd pain       Objective:    BP 136/82   Pulse 68   Temp 98.6 F (37 C) (Oral)   Resp 14   Ht 5\' 9"  (1.753 m)   Wt 193 lb (87.5 kg)   SpO2 98%   BMI 28.50 kg/m  GEN- NAD, alert and oriented x3        Assessment & Plan:      Problem List Items Addressed This Visit      Unprioritized   Hyperlipidemia - Primary    Reiterated dietary measures.  He was given a handout on foods to eatAnd those to avoid.  Also discussed the importance of cutting out the alcohol is also contributes to the high cholesterol.  He is to have his labs rechecked in 3 months if there is no significant improvement he will start statin drug.  We discussed the risk factors of hyperlipidemia.      HSV-1 infection    HSV infection but not called in the  significant problems.  We will just treat as needed with topicals.         Note: This dictation was prepared with Dragon dictation along with smaller phrase technology. Any transcriptional errors that result from this process are unintentional.

## 2017-07-12 ENCOUNTER — Ambulatory Visit (HOSPITAL_COMMUNITY): Admission: RE | Admit: 2017-07-12 | Payer: 59 | Source: Ambulatory Visit

## 2017-08-13 ENCOUNTER — Ambulatory Visit: Payer: 59 | Admitting: Urology

## 2017-08-13 DIAGNOSIS — N50812 Left testicular pain: Secondary | ICD-10-CM | POA: Diagnosis not present

## 2017-08-13 DIAGNOSIS — N5201 Erectile dysfunction due to arterial insufficiency: Secondary | ICD-10-CM

## 2017-10-07 ENCOUNTER — Other Ambulatory Visit: Payer: 59

## 2017-11-04 ENCOUNTER — Encounter: Payer: Self-pay | Admitting: Family Medicine

## 2018-01-03 ENCOUNTER — Ambulatory Visit: Payer: 59 | Admitting: Family Medicine

## 2018-01-14 ENCOUNTER — Encounter: Payer: Self-pay | Admitting: Family Medicine

## 2018-01-14 ENCOUNTER — Ambulatory Visit: Payer: 59 | Admitting: Family Medicine

## 2018-01-14 ENCOUNTER — Other Ambulatory Visit: Payer: Self-pay

## 2018-01-14 VITALS — BP 128/70 | HR 76 | Temp 98.3°F | Resp 16 | Ht 69.0 in | Wt 192.0 lb

## 2018-01-14 DIAGNOSIS — E785 Hyperlipidemia, unspecified: Secondary | ICD-10-CM | POA: Diagnosis not present

## 2018-01-14 NOTE — Progress Notes (Signed)
Patient ID: Xavier Bauer, male    DOB: 03-19-1971, 47 y.o.   MRN: [id number]  PCP: Salley Scarlet, MD  Chief Complaint  Patient presents with  . Follow-up    is not fasting- need lipids re-checked    Subjective:   Xavier Bauer is a 47 y.o. male, presents to clinic with CC of HLD, is here for routine follow-up and repeated lab work.  He was diagnosed with elevated cholesterol 6 months ago and was to try dietary changes and exercise to decrease and otherwise would likely need to start a statin medication.  He is here today for this appointment, there was some confusion apparently about his appointment time, he came yesterday and then came today and was unable to fast today.  Reports that he has been working on dietary changes he has been avoiding fried foods and been eating much more baked and grilled meats and increased vegetables.  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  He is also taking care of his mother who is had a lot of health issues over the last year.  He hopes to start exercising soon.  He denies any notice weight changes.   Wt Readings from Last 5 Encounters:  01/14/18 192 lb (87.1 kg)  07/05/17 193 lb (87.5 kg)  06/28/17 192 lb (87.1 kg)  03/09/17 185 lb (83.9 kg)  07/04/14 160 lb (72.6 kg)   Family hx of HTN  He was encouraged at his last doctor's appointment to decrease EtOH, he states at this time that he has decreased alcohol intake, states "hasn't had time to drink".    Patient Active Problem List   Diagnosis Date Noted  . HSV-1 infection 07/05/2017  . Alcohol use 06/28/2017  . Hyperlipidemia 03/31/2012  . Testicular pain 11/30/2011  . Elevated blood pressure 11/30/2011     Prior to Admission medications   Not on File     No Known Allergies   Family History  Problem Relation Age of Onset  . Hypertension Mother   .  Hyperlipidemia Mother   . Hypertension Father   . Hyperlipidemia Father   . Diabetes Father   . Hypertension Brother   . Stroke Maternal Grandmother      Social History   Socioeconomic History  . Marital status: Divorced    Spouse name: Not on file  . Number of children: Not on file  . Years of education: Not on file  . Highest education level: Not on file  Occupational History  . Not on file  Social Needs  . Financial resource strain: Not on file  . Food insecurity:    Worry: Not on file    Inability: Not on file  . Transportation needs:    Medical: Not on file    Non-medical: Not on file  Tobacco Use  . Smoking status: Former Smoker    Packs/day: 0.00    Types: Cigarettes    Last attempt to quit: 06/28/2016    Years since quitting: 1.5  . Smokeless tobacco: Never Used  Substance and Sexual Activity  . Alcohol use: Yes    Comment: weekly   . Drug use: No  . Sexual activity: Yes  Lifestyle  . Physical activity:    Days per week: Not on file    Minutes per session: Not on file  .  Stress: Not on file  Relationships  . Social connections:    Talks on phone: Not on file    Gets together: Not on file    Attends religious service: Not on file    Active member of club or organization: Not on file    Attends meetings of clubs or organizations: Not on file    Relationship status: Not on file  . Intimate partner violence:    Fear of current or ex partner: Not on file    Emotionally abused: Not on file    Physically abused: Not on file    Forced sexual activity: Not on file  Other Topics Concern  . Not on file  Social History Narrative  . Not on file     Review of Systems  Constitutional: Negative.  Negative for activity change, appetite change, fatigue and unexpected weight change.  HENT: Negative.   Eyes: Negative.   Respiratory: Negative.  Negative for shortness of breath.   Cardiovascular: Negative.  Negative for chest pain, palpitations and leg swelling.    Gastrointestinal: Negative.  Negative for abdominal pain and blood in stool.  Endocrine: Negative.   Genitourinary: Negative.  Negative for decreased urine volume, difficulty urinating, testicular pain and urgency.  Skin: Negative.  Negative for color change and pallor.  Allergic/Immunologic: Negative.   Neurological: Negative.  Negative for syncope, weakness, light-headedness and numbness.  Psychiatric/Behavioral: Negative.  Negative for confusion, dysphoric mood, self-injury and suicidal ideas. The patient is not nervous/anxious.   All other systems reviewed and are negative.      Objective:    Vitals:   01/14/18 1143  BP: 128/70  Pulse: 76  Resp: 16  Temp: 98.3 F (36.8 C)  TempSrc: Oral  SpO2: 98%  Weight: 192 lb (87.1 kg)  Height: 5\' 9"  (1.753 m)      Physical Exam  Constitutional: He is oriented to person, place, and time. He appears well-developed and well-nourished.  Non-toxic appearance. He does not appear ill. No distress.  HENT:  Head: Normocephalic and atraumatic.  Right Ear: Tympanic membrane, external ear and ear canal normal.  Left Ear: Tympanic membrane, external ear and ear canal normal.  Nose: No mucosal edema or rhinorrhea. Right sinus exhibits no maxillary sinus tenderness and no frontal sinus tenderness. Left sinus exhibits no maxillary sinus tenderness and no frontal sinus tenderness.  Mouth/Throat: Uvula is midline and oropharynx is clear and moist. No trismus in the jaw. No uvula swelling. No oropharyngeal exudate, posterior oropharyngeal edema or posterior oropharyngeal erythema.  Eyes: Pupils are equal, round, and reactive to light. Conjunctivae, EOM and lids are normal.  Neck: Trachea normal, normal range of motion and phonation normal. Neck supple. No tracheal deviation present.  Cardiovascular: Normal rate, regular rhythm, normal heart sounds, intact distal pulses and normal pulses. Exam reveals no gallop and no friction rub.  No murmur  heard. Pulses:      Radial pulses are 2+ on the right side, and 2+ on the left side.       Posterior tibial pulses are 2+ on the right side, and 2+ on the left side.  Pulmonary/Chest: Effort normal and breath sounds normal. No respiratory distress. He has no wheezes. He has no rhonchi. He has no rales.  Abdominal: Soft. Normal appearance and bowel sounds are normal. He exhibits no distension. There is no tenderness. There is no rebound and no guarding.  Musculoskeletal: Normal range of motion. He exhibits no edema.  Neurological: He is alert and oriented  to person, place, and time. He exhibits normal muscle tone. Coordination and gait normal.  Skin: Skin is warm, dry and intact. Capillary refill takes less than 2 seconds. No rash noted. He is not diaphoretic.  Psychiatric: He has a normal mood and affect. His speech is normal and behavior is normal.          Assessment & Plan:      ICD-10-CM   1. Hyperlipidemia, unspecified hyperlipidemia type E78.5 Lipid Panel    COMPLETE METABOLIC PANEL WITH GFR    Patient here for six-month recheck of hyperlipidemia after working on dietary changes and exercise, he is not fasting today due to a mixup.  He will return for fasting lab work.  Per his report he has avoided fatty or fried foods, has been eating baked and grilled meats and increasing vegetables.  He is having difficulty exercising due to very busy schedule at work working overtime and also taking care of his mother.  He has decrease his alcohol intake.  He feels generally good and healthy and has no concerning symptoms of any type of peripheral vascular or coronary artery disease.  He will return for fasting labs as soon as he can arrange it with his job.     Danelle Berry, PA-C 01/14/18 11:59 AM

## 2018-01-14 NOTE — Patient Instructions (Signed)
Cholesterol Cholesterol is a fat. Your body needs a small amount of cholesterol. Cholesterol (plaque) may build up in your blood vessels (arteries). That makes you more likely to have a heart attack or stroke. You cannot feel your cholesterol level. Having a blood test is the only way to find out if your level is high. Keep your test results. Work with your doctor to keep your cholesterol at a good level. What do the results mean?  Total cholesterol is how much cholesterol is in your blood.  LDL is bad cholesterol. This is the type that can build up. Try to have low LDL.  HDL is good cholesterol. It cleans your blood vessels and carries LDL away. Try to have high HDL.  Triglycerides are fat that the body can store or burn for energy. What are good levels of cholesterol?  Total cholesterol below 200.  LDL below 100 is good for people who have health risks. LDL below 70 is good for people who have very high risks.  HDL above 40 is good. It is best to have HDL of 60 or higher.  Triglycerides below 150. How can I lower my cholesterol? Diet Follow your diet program as told by your doctor.  Choose fish, white meat chicken, or turkey that is roasted or baked. Try not to eat red meat, fried foods, sausage, or lunch meats.  Eat lots of fresh fruits and vegetables.  Choose whole grains, beans, pasta, potatoes, and cereals.  Choose olive oil, corn oil, or canola oil. Only use small amounts.  Try not to eat butter, mayonnaise, shortening, or palm kernel oils.  Try not to eat foods with trans fats.  Choose low-fat or nonfat dairy foods. ? Drink skim or nonfat milk. ? Eat low-fat or nonfat yogurt and cheeses. ? Try not to drink whole milk or cream. ? Try not to eat ice cream, egg yolks, or full-fat cheeses.  Healthy desserts include angel food cake, ginger snaps, animal crackers, hard candy, popsicles, and low-fat or nonfat frozen yogurt. Try not to eat pastries, cakes, pies, and  cookies.  Exercise Follow your exercise program as told by your doctor.  Be more active. Try gardening, walking, and taking the stairs.  Ask your doctor about ways that you can be more active.  Medicine  Take over-the-counter and prescription medicines only as told by your doctor. This information is not intended to replace advice given to you by your health care provider. Make sure you discuss any questions you have with your health care provider. Document Released: 06/29/2008 Document Revised: 11/02/2015 Document Reviewed: 10/13/2015 Elsevier Interactive Patient Education  2018 Elsevier Inc.  

## 2018-01-28 ENCOUNTER — Other Ambulatory Visit: Payer: 59

## 2018-01-28 DIAGNOSIS — E785 Hyperlipidemia, unspecified: Secondary | ICD-10-CM | POA: Diagnosis not present

## 2018-01-28 LAB — COMPLETE METABOLIC PANEL WITH GFR
AG Ratio: 1.5 (calc) (ref 1.0–2.5)
ALBUMIN MSPROF: 4.3 g/dL (ref 3.6–5.1)
ALT: 32 U/L (ref 9–46)
AST: 42 U/L — ABNORMAL HIGH (ref 10–40)
Alkaline phosphatase (APISO): 60 U/L (ref 40–115)
BUN: 15 mg/dL (ref 7–25)
CALCIUM: 9.3 mg/dL (ref 8.6–10.3)
CO2: 26 mmol/L (ref 20–32)
Chloride: 101 mmol/L (ref 98–110)
Creat: 1.12 mg/dL (ref 0.60–1.35)
GFR, EST AFRICAN AMERICAN: 90 mL/min/{1.73_m2} (ref >=60)
GFR, EST NON AFRICAN AMERICAN: 77 mL/min/{1.73_m2} (ref >=60)
GLOBULIN: 2.9 g/dL (ref 1.9–3.7)
Glucose, Bld: 92 mg/dL (ref 65–99)
Potassium: 4.3 mmol/L (ref 3.5–5.3)
SODIUM: 138 mmol/L (ref 135–146)
TOTAL PROTEIN: 7.2 g/dL (ref 6.1–8.1)
Total Bilirubin: 0.6 mg/dL (ref 0.2–1.2)

## 2018-01-28 LAB — LIPID PANEL
Cholesterol: 237 mg/dL — ABNORMAL HIGH (ref <200)
HDL: 47 mg/dL (ref >40)
LDL Cholesterol (Calc): 158 mg/dL (calc) — ABNORMAL HIGH
Non-HDL Cholesterol (Calc): 190 mg/dL (calc) — ABNORMAL HIGH (ref <130)
Total CHOL/HDL Ratio: 5 (calc) — ABNORMAL HIGH (ref <5.0)
Triglycerides: 168 mg/dL — ABNORMAL HIGH (ref <150)

## 2019-05-04 IMAGING — CT CT RENAL STONE PROTOCOL
2 of 4 series · 16 of 46 positions shown, 18 images · non-contrast
Comparison: No priors.

CLINICAL DATA: 46-year-old male with history of low back pain and
low anterior abdominal pressure since midnight.

EXAM:
CT ABDOMEN AND PELVIS WITHOUT CONTRAST
TECHNIQUE: Multidetector CT imaging of the abdomen and pelvis was performed
following the standard protocol without IV contrast.

[Series 2: axial st · axial · 0.73mm/px · z∈[-513,-83]mm · 13 of 94 slices shown, 15 images]
[im 4/94  soft-tissue]
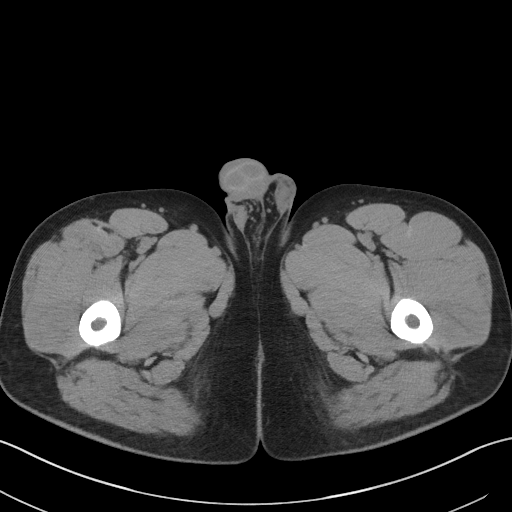
[im 4/94  bone]
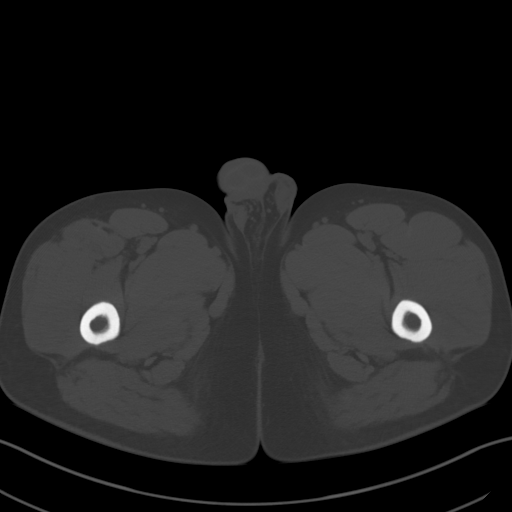
[im 11/94  soft-tissue]
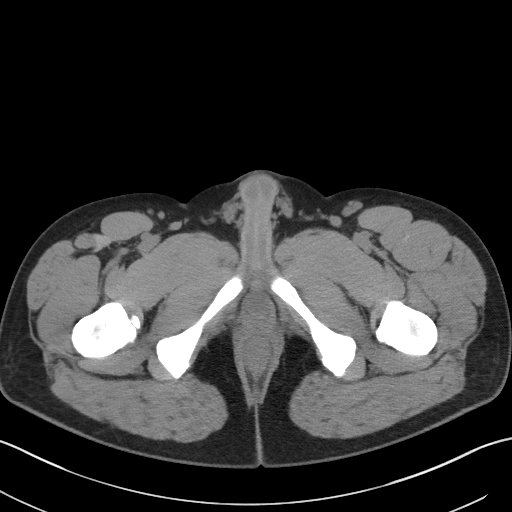
[im 18/94  soft-tissue]
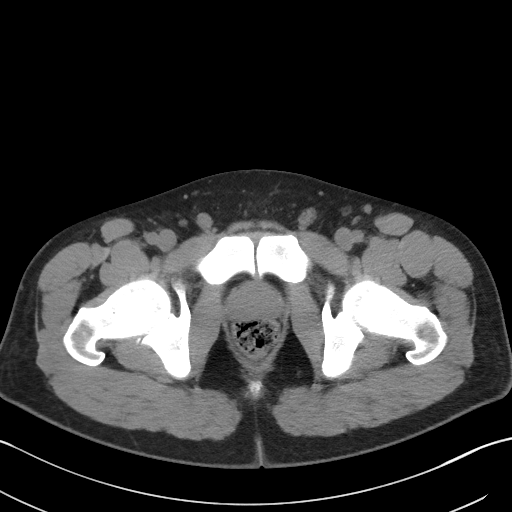
[im 26/94  soft-tissue]
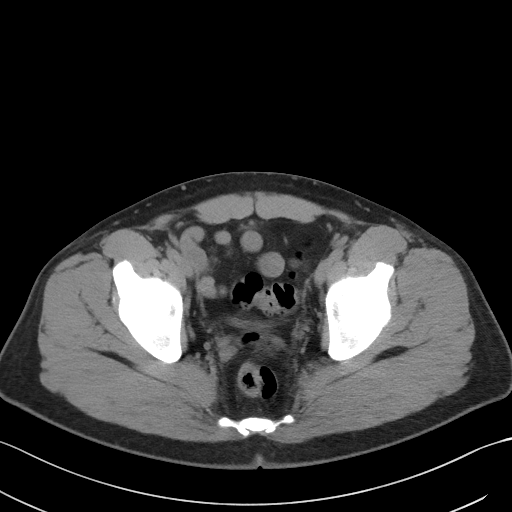
[im 33/94  soft-tissue]
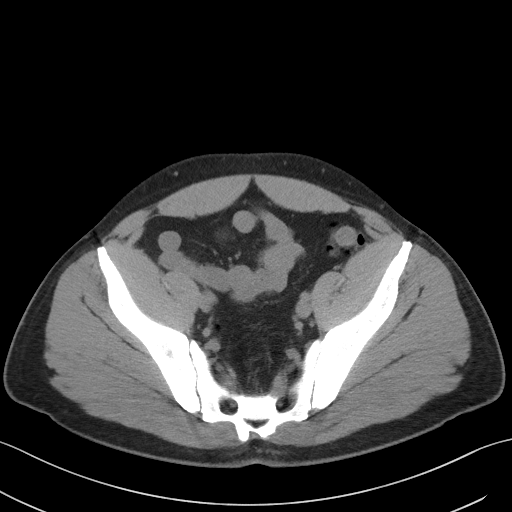
[im 40/94  soft-tissue]
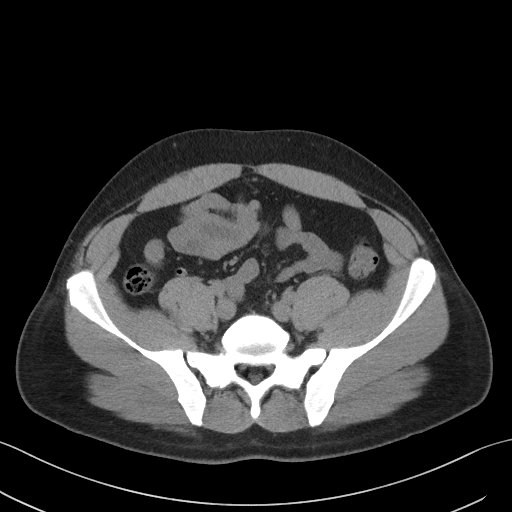
[im 47/94  soft-tissue]
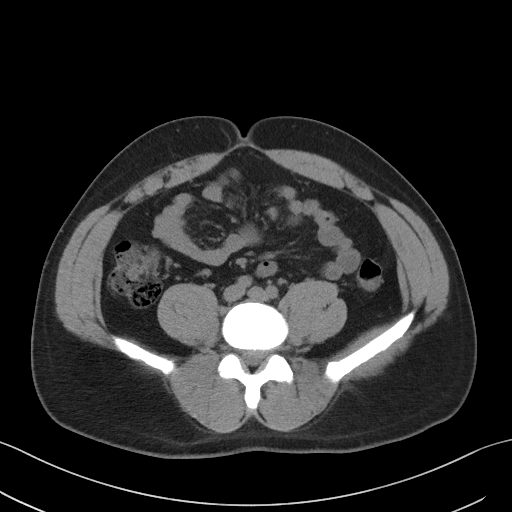
[im 54/94  soft-tissue]
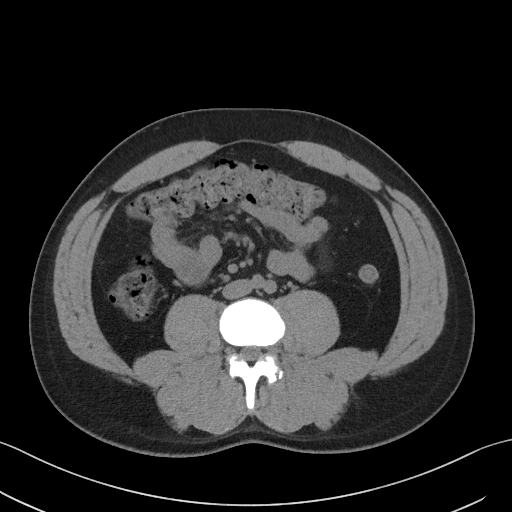
[im 61/94  soft-tissue]
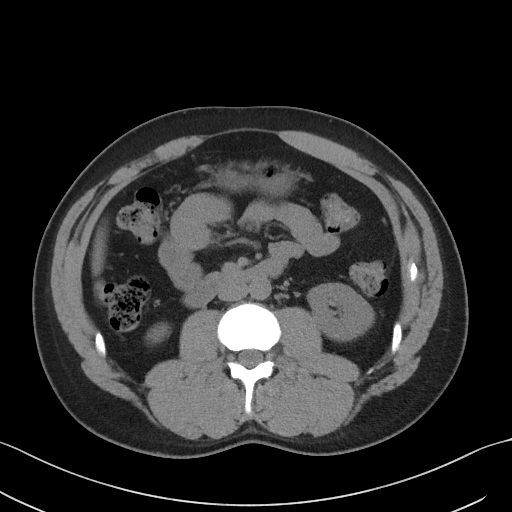
[im 61/94  bone]
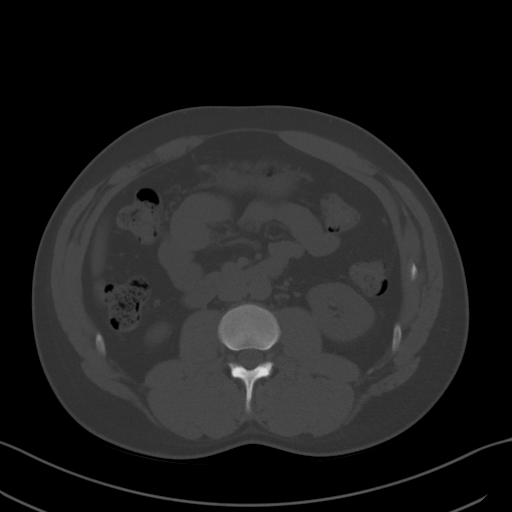
[im 68/94  soft-tissue]
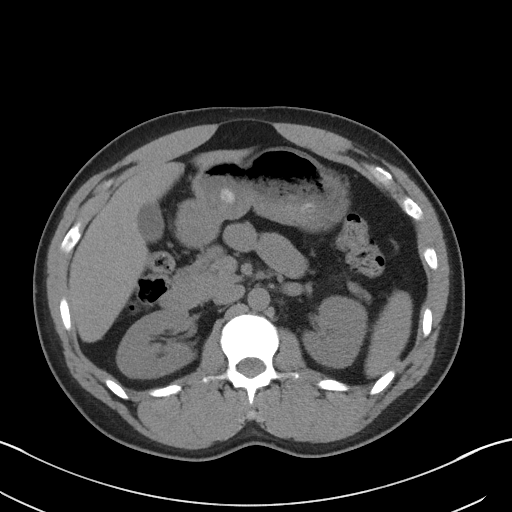
[im 76/94  soft-tissue]
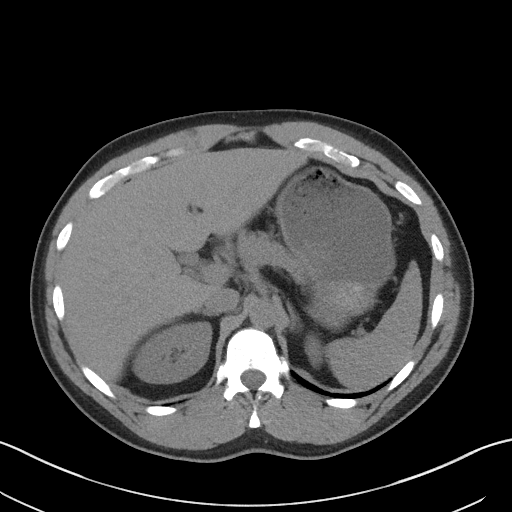
[im 83/94  soft-tissue]
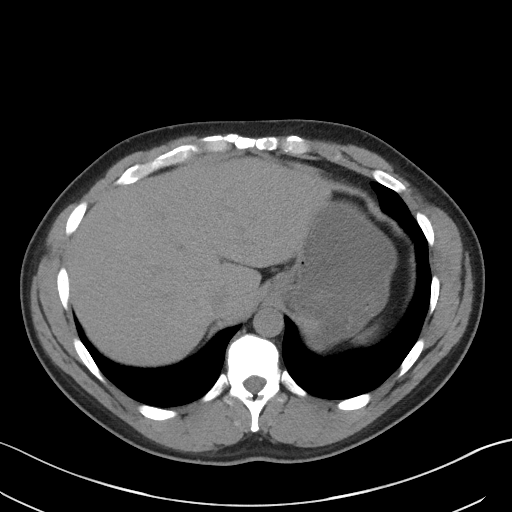
[im 90/94  soft-tissue]
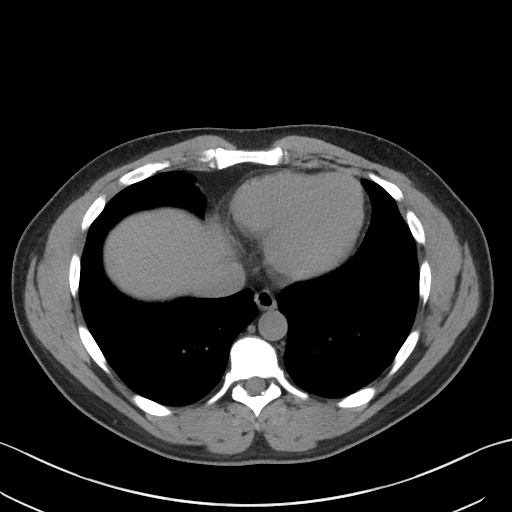

[Series 5: coronal st · coronal · 0.79mm/px · 3 of 94 slices shown]
[im 32/94  soft-tissue]
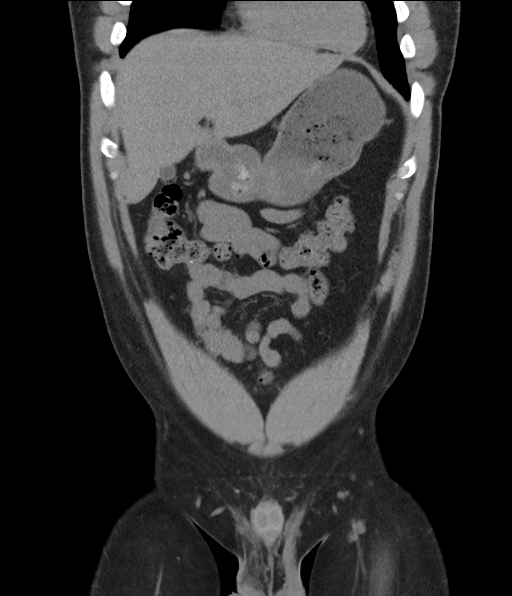
[im 42/94  soft-tissue]
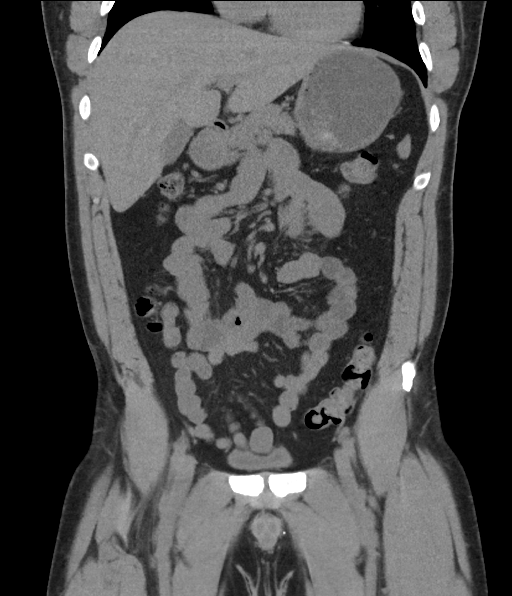
[im 52/94  soft-tissue]
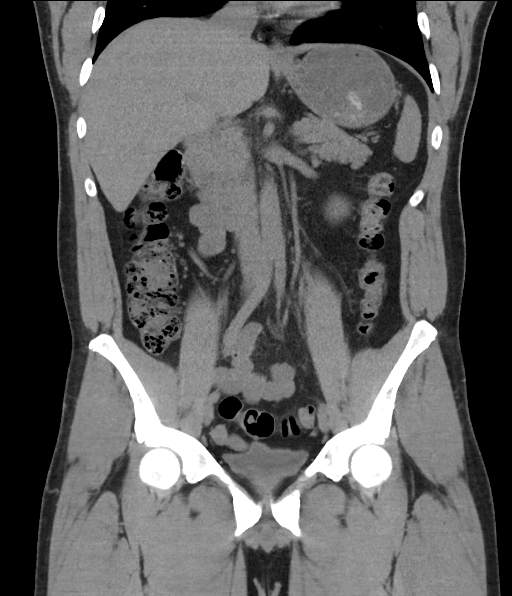

[16 of 46 positions shown; findings below may reference images not displayed]

FINDINGS: Lower chest: Unremarkable.

Hepatobiliary: No definite cystic or solid hepatic lesions are noted
on today's noncontrast CT examination. Unenhanced appearance of the
gallbladder is normal.

Pancreas: No definite pancreatic mass or peripancreatic inflammatory
changes are noted on today's noncontrast CT examination.

Spleen: Unremarkable.

Adrenals/Urinary Tract: There are no abnormal calcifications within
the collecting system of either kidney, along the course of either
ureter, or within the lumen of the urinary bladder. No
hydroureteronephrosis or perinephric stranding to suggest urinary
tract obstruction at this time. The unenhanced appearance of the
right kidney is unremarkable. 1.2 cm low-attenuation lesion in the
upper pole the left kidney is incompletely characterized on today's
noncontrast CT examination, but is statistically likely a tiny cyst.
Unenhanced appearance of the urinary bladder is normal. Urinary
bladder is normal in appearance.

Stomach/Bowel: Unenhanced appearance of the stomach is normal. No
pathologic dilatation of small bowel or colon. Normal appendix.

Vascular/Lymphatic: No atherosclerotic calcifications in the
abdominal or pelvic vasculature. No lymphadenopathy noted in the
abdomen or pelvis.

Reproductive: Prostate gland and seminal vesicles are unremarkable
in appearance.

Other: No significant volume of ascites.  No pneumoperitoneum.

Musculoskeletal: There are no aggressive appearing lytic or blastic
lesions noted in the visualized portions of the skeleton.
IMPRESSION: 1. No acute findings are noted in the abdomen or pelvis to account
for the patient's symptoms. Specifically, no urinary tract calculi
and no findings to suggest urinary tract obstruction.
2. Normal appendix.

## 2021-10-16 ENCOUNTER — Ambulatory Visit: Payer: 59 | Admitting: Family Medicine

## 2021-10-16 VITALS — BP 160/90 | HR 74 | Temp 98.3°F | Ht 68.0 in | Wt 183.6 lb

## 2021-10-16 DIAGNOSIS — Z720 Tobacco use: Secondary | ICD-10-CM

## 2021-10-16 DIAGNOSIS — Z130 Encounter for screening for diseases of the blood and blood-forming organs and certain disorders involving the immune mechanism: Secondary | ICD-10-CM

## 2021-10-16 DIAGNOSIS — Z125 Encounter for screening for malignant neoplasm of prostate: Secondary | ICD-10-CM | POA: Diagnosis not present

## 2021-10-16 DIAGNOSIS — Z1211 Encounter for screening for malignant neoplasm of colon: Secondary | ICD-10-CM

## 2021-10-16 DIAGNOSIS — R7309 Other abnormal glucose: Secondary | ICD-10-CM

## 2021-10-16 DIAGNOSIS — I10 Essential (primary) hypertension: Secondary | ICD-10-CM

## 2021-10-16 DIAGNOSIS — E782 Mixed hyperlipidemia: Secondary | ICD-10-CM | POA: Diagnosis not present

## 2021-10-16 MED ORDER — AMLODIPINE BESYLATE 5 MG PO TABS: 5.0000 mg | ORAL_TABLET | Freq: Every day | ORAL | 3 refills | Status: DC

## 2021-10-16 NOTE — Progress Notes (Signed)
Subjective:  Patient ID: Xavier Bauer, male    DOB: [birth year]  Age: 51 y.o. MRN: [id number]  CC: Chief Complaint  Patient presents with   New Patient (Initial Visit)    Establish care. 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    HPI:  51 year old male presents to establish care.  Patient has not seen a physician since 2019.  He has a history of elevated blood pressure readings, hyperlipidemia, tobacco use, and alcohol use.  Patient states that he knows that he is overdue for colonoscopy.  He would like referral to GI for colonoscopy.  He currently smokes approximately 1 pack a week.  I advised the patient that he needs to quit smoking.  He drinks alcohol on a fairly regular basis.  Amount varies.  He reports that he was recently at work and his blood pressure was taken and was elevated.  He has a history of prior readings that have been elevated and his blood pressure is elevated here.  He states that overall he is feeling well.  He has no chest pain or shortness of breath.  No other complaints or concerns at this time.   Patient Active Problem List   Diagnosis Date Noted   Essential hypertension 10/16/2021   Tobacco use 10/16/2021   HSV-1 infection 07/05/2017   Alcohol use 06/28/2017   Hyperlipidemia 03/31/2012    Social Hx   Social History   Socioeconomic History   Marital status: Divorced    Spouse name: Not on file   Number of children: Not on file   Years of education: Not on file   Highest education level: Not on file  Occupational History   Not on file  Tobacco Use   Smoking status: Former    Packs/day: 0.00    Types: Cigarettes    Quit date: 06/28/2016    Years since quitting: 5.3   Smokeless tobacco: Never  Vaping Use   Vaping Use: Never used  Substance and Sexual Activity   Alcohol use: Yes    Comment: weekly    Drug use: No   Sexual activity: Yes  Other Topics Concern   Not on file  Social  History Narrative   Not on file   Social Determinants of Health   Financial Resource Strain: Not on file  Food Insecurity: Not on file  Transportation Needs: Not on file  Physical Activity: Not on file  Stress: Not on file  Social Connections: Not on file    Review of Systems Per HPI  Objective:  BP (!) 160/90   Pulse 74   Temp 98.3 F (36.8 C) (Oral)   Ht 5' 8"  (1.727 m)   Wt 183 lb 9.6 oz (83.3 kg)   SpO2 99%   BMI 27.92 kg/m      10/16/2021   10:45 AM 10/16/2021   10:16 AM 01/14/2018   11:43 AM  BP/Weight  Systolic BP 544 920 100  Diastolic BP 90 76 70  Wt. (Lbs)  183.6 192  BMI  27.92 kg/m2 28.35 kg/m2    Physical Exam Vitals and nursing note reviewed.  Constitutional:      General: He is not in acute distress.    Appearance: Normal appearance.  HENT:     Head: Normocephalic and atraumatic.  Eyes:     General:        Right eye: No discharge.  Left eye: No discharge.     Conjunctiva/sclera: Conjunctivae normal.  Cardiovascular:     Rate and Rhythm: Normal rate and regular rhythm.  Pulmonary:     Effort: Pulmonary effort is normal.     Breath sounds: Normal breath sounds. No wheezing, rhonchi or rales.  Neurological:     Mental Status: He is alert.  Psychiatric:        Mood and Affect: Mood normal.        Behavior: Behavior normal.     Lab Results  Component Value Date   WBC 3.8 06/28/2017   HGB 14.2 06/28/2017   HCT 43.3 06/28/2017   PLT 229 06/28/2017   GLUCOSE 92 01/28/2018   CHOL 237 (H) 01/28/2018   TRIG 168 (H) 01/28/2018   HDL 47 01/28/2018   LDLCALC 158 (H) 01/28/2018   ALT 32 01/28/2018   AST 42 (H) 01/28/2018   NA 138 01/28/2018   K 4.3 01/28/2018   CL 101 01/28/2018   CREATININE 1.12 01/28/2018   BUN 15 01/28/2018   CO2 26 01/28/2018   PSA 0.8 06/28/2017   HGBA1C 5.0 06/28/2017     Assessment & Plan:   Problem List Items Addressed This Visit       Cardiovascular and Mediastinum   Essential hypertension -  Primary    Starting on amlodipine.  Labs today.      Relevant Medications   amLODipine (NORVASC) 5 MG tablet   Other Relevant Orders   CMP14+EGFR     Other   Hyperlipidemia    Unsure of control.  Awaiting labs.      Relevant Medications   amLODipine (NORVASC) 5 MG tablet   Other Relevant Orders   Lipid panel   Tobacco use    Advised to quit.      Other Visit Diagnoses     Screening for deficiency anemia       Relevant Orders   CBC   Prostate cancer screening       Relevant Orders   PSA   Elevated glucose       Relevant Orders   Hemoglobin A1c   Colon cancer screening       Relevant Orders   Ambulatory referral to Gastroenterology       Meds ordered this encounter  Medications   amLODipine (NORVASC) 5 MG tablet    Sig: Take 1 tablet (5 mg total) by mouth daily.    Dispense:  90 tablet    Refill:  3    Follow-up:  Return in about 3 months (around 01/16/2022) for HTN follow up.  [place]

## 2021-10-16 NOTE — Assessment & Plan Note (Signed)
Starting on amlodipine.  Labs today.

## 2021-10-16 NOTE — Patient Instructions (Signed)
I have placed a referral regarding colonoscopy.  Labs ordered.  You can do them today if you like.  Recommend tobacco cessation.  No more than 2 alcoholic beverages per day.  Follow-up in 3 months regarding her hypertension.

## 2021-10-16 NOTE — Assessment & Plan Note (Signed)
Advised to quit.  

## 2021-10-16 NOTE — Assessment & Plan Note (Signed)
Unsure of control.  Awaiting labs.

## 2021-10-17 LAB — CMP14+EGFR
ALT: 19 IU/L (ref 0–44)
AST: 22 IU/L (ref 0–40)
Albumin/Globulin Ratio: 1.7 (ref 1.2–2.2)
Albumin: 4.9 g/dL (ref 3.8–4.9)
Alkaline Phosphatase: 72 IU/L (ref 44–121)
BUN/Creatinine Ratio: 12 (ref 9–20)
BUN: 14 mg/dL (ref 6–24)
Bilirubin Total: 0.6 mg/dL (ref 0.0–1.2)
CO2: 23 mmol/L (ref 20–29)
Calcium: 9.9 mg/dL (ref 8.7–10.2)
Chloride: 101 mmol/L (ref 96–106)
Creatinine, Ser: 1.15 mg/dL (ref 0.76–1.27)
Globulin, Total: 2.9 g/dL (ref 1.5–4.5)
Glucose: 117 mg/dL — ABNORMAL HIGH (ref 70–99)
Potassium: 5.1 mmol/L (ref 3.5–5.2)
Sodium: 140 mmol/L (ref 134–144)
Total Protein: 7.8 g/dL (ref 6.0–8.5)
eGFR: 77 mL/min/{1.73_m2} (ref >59)

## 2021-10-17 LAB — LIPID PANEL
Chol/HDL Ratio: 4.4 ratio (ref 0.0–5.0)
Cholesterol, Total: 239 mg/dL — ABNORMAL HIGH (ref 100–199)
HDL: 54 mg/dL (ref >39)
LDL Chol Calc (NIH): 132 mg/dL — ABNORMAL HIGH (ref 0–99)
Triglycerides: 296 mg/dL — ABNORMAL HIGH (ref 0–149)
VLDL Cholesterol Cal: 53 mg/dL — ABNORMAL HIGH (ref 5–40)

## 2021-10-17 LAB — CBC
Hematocrit: 46.7 % (ref 37.5–51.0)
Hemoglobin: 15.4 g/dL (ref 13.0–17.7)
MCH: 28.2 pg (ref 26.6–33.0)
MCHC: 33 g/dL (ref 31.5–35.7)
MCV: 86 fL (ref 79–97)
Platelets: 238 10*3/uL (ref 150–450)
RBC: 5.46 x10E6/uL (ref 4.14–5.80)
RDW: 12.1 % (ref 11.6–15.4)
WBC: 3.7 10*3/uL (ref 3.4–10.8)

## 2021-10-17 LAB — HEMOGLOBIN A1C
Est. average glucose Bld gHb Est-mCnc: 117 mg/dL
Hgb A1c MFr Bld: 5.7 % — ABNORMAL HIGH (ref 4.8–5.6)

## 2021-10-17 LAB — PSA: Prostate Specific Ag, Serum: 1.3 ng/mL (ref 0.0–4.0)

## 2021-10-20 ENCOUNTER — Other Ambulatory Visit: Payer: Self-pay | Admitting: Family Medicine

## 2021-10-20 MED ORDER — ROSUVASTATIN CALCIUM 10 MG PO TABS: 10.0000 mg | ORAL_TABLET | Freq: Every day | ORAL | 3 refills | Status: DC

## 2021-10-23 ENCOUNTER — Encounter: Payer: Self-pay | Admitting: *Deleted

## 2021-11-08 ENCOUNTER — Encounter: Payer: Self-pay | Admitting: *Deleted

## 2021-11-08 NOTE — Patient Instructions (Signed)
  Procedure: Colonoscopy  Estimated body mass index is 27.92 kg/m as calculated from the following:   Height as of 10/16/21: 5\' 8"  (1.727 m).   Weight as of 10/16/21: 183 lb 9.6 oz (83.3 kg).   Have you had a colonoscopy before?  no  Do you have family history of colon cancer  no  Do you have a family history of polyps? no  Previous colonoscopy with polyps removed? N/a  Do you have a history colorectal cancer?   no  Are you diabetic?  no  Do you have a prosthetic or mechanical heart valve? no  Do you have a pacemaker/defibrillator?   no  Have you had endocarditis/atrial fibrillation?  no  Do you use supplemental oxygen/CPAP?  no  Have you had joint replacement within the last 12 months?  no  Do you tend to be constipated or have to use laxatives?  no   Do you have history of alcohol use? If yes, how much and how often.  Yes 6-12 per week  Do you have history or are you using drugs? If yes, what do are you  using?  no  Have you ever had a stroke/heart attack?  no  Have you ever had a heart or other vascular stent placed,?no  Do you take weight loss medication? no  Do you take any blood-thinning medications such as: (Plavix, aspirin, Coumadin, Aggrenox, Brilinta, Xarelto, Eliquis, Pradaxa, Savaysa or Effient) no  If yes we need the name, milligram, dosage and who is prescribing doctor:               Current Outpatient Medications  Medication Sig Dispense Refill   amLODipine (NORVASC) 5 MG tablet Take 1 tablet (5 mg total) by mouth daily. 90 tablet 3   rosuvastatin (CRESTOR) 10 MG tablet Take 1 tablet (10 mg total) by mouth daily. 90 tablet 3   No current facility-administered medications for this visit.    No Known Allergies

## 2021-11-14 NOTE — Progress Notes (Signed)
OK to schedule. ASA 2.  ?

## 2021-11-15 NOTE — Progress Notes (Signed)
Lmovm for pt 

## 2021-11-17 ENCOUNTER — Encounter: Payer: Self-pay | Admitting: *Deleted

## 2021-11-17 MED ORDER — NA SULFATE-K SULFATE-MG SULF 17.5-3.13-1.6 GM/177ML PO SOLN: ORAL | 0 refills | Status: DC

## 2021-11-17 NOTE — Progress Notes (Signed)
Pt called back. Scheduled for 8/25 at 2:30pm with Dr. Jena Gauss. Needed a Friday d/t his work schedule  Checked PA via [employer]. No PA required. Decision ID #:[id number]

## 2021-11-17 NOTE — Progress Notes (Signed)
LMOVM to call back 

## 2021-12-06 ENCOUNTER — Encounter (HOSPITAL_COMMUNITY): Payer: Self-pay

## 2021-12-06 ENCOUNTER — Encounter (HOSPITAL_COMMUNITY)
Admission: RE | Admit: 2021-12-06 | Discharge: 2021-12-06 | Disposition: A | Discharge status: Home / Self Care | Payer: 59 | Source: Ambulatory Visit | Attending: Internal Medicine | Admitting: Internal Medicine

## 2021-12-06 HISTORY — DX: Essential (primary) hypertension: I10

## 2021-12-08 ENCOUNTER — Encounter (HOSPITAL_COMMUNITY): Payer: Self-pay | Admitting: Internal Medicine

## 2021-12-08 ENCOUNTER — Ambulatory Visit (HOSPITAL_BASED_OUTPATIENT_CLINIC_OR_DEPARTMENT_OTHER): Payer: 59 | Admitting: Anesthesiology

## 2021-12-08 ENCOUNTER — Encounter (HOSPITAL_COMMUNITY)
Admission: RE | Disposition: A | Discharge status: Home / Self Care | Payer: Self-pay | Source: Home / Self Care | Attending: Internal Medicine

## 2021-12-08 ENCOUNTER — Other Ambulatory Visit: Payer: Self-pay

## 2021-12-08 ENCOUNTER — Ambulatory Visit (HOSPITAL_COMMUNITY)
Admission: RE | Admit: 2021-12-08 | Discharge: 2021-12-08 | Disposition: A | Discharge status: Home / Self Care | Payer: 59 | Attending: Internal Medicine | Admitting: Internal Medicine

## 2021-12-08 ENCOUNTER — Ambulatory Visit (HOSPITAL_COMMUNITY): Payer: 59 | Admitting: Anesthesiology

## 2021-12-08 DIAGNOSIS — Z1211 Encounter for screening for malignant neoplasm of colon: Secondary | ICD-10-CM

## 2021-12-08 DIAGNOSIS — I10 Essential (primary) hypertension: Secondary | ICD-10-CM | POA: Diagnosis not present

## 2021-12-08 DIAGNOSIS — K635 Polyp of colon: Secondary | ICD-10-CM | POA: Diagnosis not present

## 2021-12-08 DIAGNOSIS — Z87891 Personal history of nicotine dependence: Secondary | ICD-10-CM | POA: Diagnosis not present

## 2021-12-08 HISTORY — PX: POLYPECTOMY: SHX5525

## 2021-12-08 HISTORY — PX: COLONOSCOPY WITH PROPOFOL: SHX5780

## 2021-12-08 HISTORY — DX: Pure hypercholesterolemia, unspecified: E78.00

## 2021-12-08 SURGERY — COLONOSCOPY WITH PROPOFOL
Anesthesia: Monitor Anesthesia Care

## 2021-12-08 MED ORDER — PROPOFOL 10 MG/ML IV BOLUS
INTRAVENOUS | Status: DC | PRN
  Administered 2021-12-08: 100 mg via INTRAVENOUS

## 2021-12-08 MED ORDER — PROPOFOL 500 MG/50ML IV EMUL
INTRAVENOUS | Status: DC | PRN
  Administered 2021-12-08: 150 ug/kg/min via INTRAVENOUS

## 2021-12-08 MED ORDER — LIDOCAINE HCL (CARDIAC) PF 100 MG/5ML IV SOSY
PREFILLED_SYRINGE | INTRAVENOUS | Status: DC | PRN
  Administered 2021-12-08: 40 mg via INTRAVENOUS

## 2021-12-08 MED ORDER — LACTATED RINGERS IV SOLN: INTRAVENOUS | Status: DC

## 2021-12-08 NOTE — Discharge Instructions (Addendum)
  Colonoscopy Discharge Instructions  Read the instructions outlined below and refer to this sheet in the next few weeks. These discharge instructions provide you with general information on caring for yourself after you leave the hospital. Your doctor may also give you specific instructions. While your treatment has been planned according to the most current medical practices available, unavoidable complications occasionally occur. If you have any problems or questions after discharge, call Dr. Jena Gauss at [phone]. ACTIVITY You may resume your regular activity, but move at a slower pace for the next 24 hours.  Take frequent rest periods for the next 24 hours.  Walking will help get rid of the air and reduce the bloated feeling in your belly (abdomen).  No driving for 24 hours (because of the medicine (anesthesia) used during the test).   Do not sign any important legal documents or operate any machinery for 24 hours (because of the anesthesia used during the test).  NUTRITION Drink plenty of fluids.  You may resume your normal diet as instructed by your doctor.  Begin with a light meal and progress to your normal diet. Heavy or fried foods are harder to digest and may make you feel sick to your stomach (nauseated).  Avoid alcoholic beverages for 24 hours or as instructed.  MEDICATIONS You may resume your normal medications unless your doctor tells you otherwise.  WHAT YOU CAN EXPECT TODAY Some feelings of bloating in the abdomen.  Passage of more gas than usual.  Spotting of blood in your stool or on the toilet paper.  IF YOU HAD POLYPS REMOVED DURING THE COLONOSCOPY: No aspirin products for 7 days or as instructed.  No alcohol for 7 days or as instructed.  Eat a soft diet for the next 24 hours.  FINDING OUT THE RESULTS OF YOUR TEST Not all test results are available during your visit. If your test results are not back during the visit, make an appointment with your caregiver to find out the  results. Do not assume everything is normal if you have not heard from your caregiver or the medical facility. It is important for you to follow up on all of your test results.  SEEK IMMEDIATE MEDICAL ATTENTION IF: You have more than a spotting of blood in your stool.  Your belly is swollen (abdominal distention).  You are nauseated or vomiting.  You have a temperature over 101.  You have abdominal pain or discomfort that is severe or gets worse throughout the day.        1 small polyp removed from your colon today   further recommendations to follow pending review of pathology report   at patient request, I called Cleda Daub at [phone] findings and recommendations

## 2021-12-08 NOTE — Transfer of Care (Addendum)
Immediate Anesthesia Transfer of Care Note  Patient: Xavier Bauer  Procedure(s) Performed: COLONOSCOPY WITH PROPOFOL POLYPECTOMY  Patient Location: PACU and Short Stay  Anesthesia Type:MAC  Level of Consciousness: awake  Airway & Oxygen Therapy: Patient Spontanous Breathing  Post-op Assessment: Report given to RN  Post vital signs: Reviewed  Last Vitals:  Vitals Value Taken Time  BP    Temp    Pulse    Resp    SpO2      Last Pain:  Vitals:   12/08/21 0914  TempSrc:   PainSc: 0-No pain      Patients Stated Pain Goal: 9 (45/99/77 4142)  Complications: There were no known notable events for this encounter.

## 2021-12-08 NOTE — Anesthesia Preprocedure Evaluation (Signed)
Anesthesia Evaluation  Patient identified by MRN, date of birth, ID band Patient awake    Reviewed: Allergy & Precautions, H&P , NPO status , Patient's Chart, lab work & pertinent test results, reviewed documented beta blocker date and time   Airway Mallampati: II  TM Distance: >3 FB Neck ROM: full    Dental no notable dental hx.    Pulmonary neg pulmonary ROS, former smoker,    Pulmonary exam normal breath sounds clear to auscultation       Cardiovascular Exercise Tolerance: Good hypertension, negative cardio ROS   Rhythm:regular Rate:Normal     Neuro/Psych negative neurological ROS  negative psych ROS   GI/Hepatic negative GI ROS, Neg liver ROS,   Endo/Other  negative endocrine ROS  Renal/GU negative Renal ROS  negative genitourinary   Musculoskeletal   Abdominal   Peds  Hematology negative hematology ROS (+)   Anesthesia Other Findings   Reproductive/Obstetrics negative OB ROS                             Anesthesia Physical Anesthesia Plan  ASA: 2  Anesthesia Plan: General   Post-op Pain Management:    Induction:   PONV Risk Score and Plan: Propofol infusion  Airway Management Planned:   Additional Equipment:   Intra-op Plan:   Post-operative Plan:   Informed Consent: I have reviewed the patients History and Physical, chart, labs and discussed the procedure including the risks, benefits and alternatives for the proposed anesthesia with the patient or authorized representative who has indicated his/her understanding and acceptance.     Dental Advisory Given  Plan Discussed with: CRNA  Anesthesia Plan Comments:         Anesthesia Quick Evaluation  

## 2021-12-08 NOTE — H&P (Signed)
@LOGO @   Primary Care Physician:  , DO Primary Gastroenterologist:  Dr.   Pre-Procedure History & Physical: HPI:  Xavier Bauer is a 51 y.o. male is here for a screening colonoscopy.  no bowel symptoms no family history of colon cancer in any first or second-degree relatives.  No bowel symptoms.  Past Medical History:  Diagnosis Date   High cholesterol    Hypertension     Past Surgical History:  Procedure Laterality Date   NO PAST SURGERIES      Prior to Admission medications   Medication Sig Start Date End Date Taking? Authorizing Provider  amLODipine (NORVASC) 5 MG tablet Take 1 tablet (5 mg total) by mouth daily. 10/16/21  Yes Cook, Jayce G, DO  rosuvastatin (CRESTOR) 10 MG tablet Take 1 tablet (10 mg total) by mouth daily. 10/20/21  Yes Cook, [place] G, DO  Na Sulfate-K Sulfate-Mg Sulf 17.5-3.13-1.6 GM/177ML SOLN As directed 11/17/21   Avigdor Dollar, 01/17/22, MD    Allergies as of 11/17/2021   (No Known Allergies)    Family History  Problem Relation Age of Onset   Hypertension Mother    Hyperlipidemia Mother    Hypertension Father    Hyperlipidemia Father    Diabetes Father    Hypertension Brother    Stroke Maternal Grandmother     Social History   Socioeconomic History   Marital status: Divorced    Spouse name: Not on file   Number of children: Not on file   Years of education: Not on file   Highest education level: Not on file  Occupational History   Not on file  Tobacco Use   Smoking status: Former    Packs/day: 0.00    Types: Cigarettes    Quit date: 06/28/2016    Years since quitting: 5.4   Smokeless tobacco: Never  Vaping Use   Vaping Use: Never used  Substance and Sexual Activity   Alcohol use: Yes    Comment: weekly    Drug use: No   Sexual activity: Yes  Other Topics Concern   Not on file  Social History Narrative   Not on file   Social Determinants of Health   Financial Resource Strain: Not on file  Food Insecurity: Not on file   Transportation Needs: Not on file  Physical Activity: Not on file  Stress: Not on file  Social Connections: Not on file  Intimate Partner Violence: Not on file    Review of Systems: See HPI, otherwise negative ROS  Physical Exam: BP 136/78   Pulse 77   Temp 97.9 F (36.6 C) (Oral)   Resp 18   SpO2 100%  General:   Alert,  Well-developed, well-nourished, pleasant and cooperative in NAD Lungs:  Clear throughout to auscultation.   No wheezes, crackles, or rhonchi. No acute distress. Heart:  Regular rate and rhythm; no murmurs, clicks, rubs,  or gallops. Abdomen:  Soft, nontender and nondistended. No masses, hepatosplenomegaly or hernias noted. Normal bowel sounds, without guarding, and without rebound.    Impression/Plan: Xavier Bauer is now here to undergo a screening colonoscopy.   first-ever average risk screening examination  Risks, benefits, limitations, imponderables and alternatives regarding colonoscopy have been reviewed with the patient. Questions have been answered. All parties agreeable.     Notice:  This dictation was prepared with Dragon dictation along with smaller phrase technology. Any transcriptional errors that result from this process are unintentional and may not be corrected upon review.

## 2021-12-08 NOTE — Op Note (Signed)
[facility] Patient Name: Xavier Bauer Procedure Date: 12/08/2021 9:03 AM MRN: [id number] Date of Birth: 03-28-1971 Attending MD: Norvel Richards , MD CSN: [id number] Age: 51 Admit Type: Outpatient Procedure:                Colonoscopy Indications:              Screening for colorectal malignant neoplasm Providers:                Norvel Richards, MD, Janeece Riggers, RN, Caprice Kluver Referring MD:              Medicines:                Propofol per Anesthesia Complications:            No immediate complications. Estimated Blood Loss:     Estimated blood loss was minimal. Procedure:                Pre-Anesthesia Assessment:                           - Prior to the procedure, a History and Physical                            was performed, and patient medications and                            allergies were reviewed. The patient's tolerance of                            previous anesthesia was also reviewed. The risks                            and benefits of the procedure and the sedation                            options and risks were discussed with the patient.                            All questions were answered, and informed consent                            was obtained. Prior Anticoagulants: The patient has                            taken no previous anticoagulant or antiplatelet                            agents. ASA Grade Assessment: III - A patient with                            severe systemic disease. After reviewing the risks  and benefits, the patient was deemed in                            satisfactory condition to undergo the procedure.                           After obtaining informed consent, the colonoscope                            was passed under direct vision. Throughout the                            procedure, the patient's blood pressure, pulse, and                            oxygen  saturations were monitored continuously. The                            [id number]) scope was introduced through the                            anus and advanced to the the cecum, identified by                            appendiceal orifice and ileocecal valve. The                            colonoscopy was performed without difficulty. The                            patient tolerated the procedure well. The quality                            of the bowel preparation was adequate. Scope In: 9:19:10 AM Scope Out: 9:32:26 AM Scope Withdrawal Time: 0 hours 10 minutes 18 seconds  Total Procedure Duration: 0 hours 13 minutes 16 seconds  Findings:      The perianal and digital rectal examinations were normal.      A 4 mm polyp was found in the splenic flexure. The polyp was sessile.       The polyp was removed with a cold snare. Resection and retrieval were       complete. Estimated blood loss was minimal.      The exam was otherwise without abnormality on direct and retroflexion       views. Impression:               - One 4 mm polyp at the splenic flexure, removed                            with a cold snare. Resected and retrieved.                           - The examination was otherwise normal on direct  and retroflexion views. Moderate Sedation:      Moderate (conscious) sedation was personally administered by an       anesthesia professional. The following parameters were monitored: oxygen       saturation, heart rate, blood pressure, respiratory rate, EKG, adequacy       of pulmonary ventilation, and response to care. Recommendation:           - Patient has a contact number available for                            emergencies. The signs and symptoms of potential                            delayed complications were discussed with the                            patient. Return to normal activities tomorrow.                            Written discharge  instructions were provided to the                            patient.                           - Resume previous diet.                           - Continue present medications.                           - Repeat colonoscopy date to be determined after                            pending pathology results are reviewed for                            surveillance.                           - Return to GI office (date not yet determined). Procedure Code(s):        --- Professional ---                           [phone], Colonoscopy, flexible; with removal of                            tumor(s), polyp(s), or other lesion(s) by snare                            technique Diagnosis Code(s):        --- Professional ---                           Z12.11, Encounter for screening for malignant  neoplasm of colon                           K63.5, Polyp of colon CPT copyright 2019 American Medical Association. All rights reserved. The codes documented in this report are preliminary and upon coder review may  be revised to meet current compliance requirements. Gerrit Friends. Constantino Starace, MD Gennette Pac, MD 12/08/2021 9:49:16 AM This report has been signed electronically. Number of Addenda: 0

## 2021-12-09 NOTE — Anesthesia Postprocedure Evaluation (Signed)
Anesthesia Post Note  Patient: Xavier Bauer  Procedure(s) Performed: COLONOSCOPY WITH PROPOFOL POLYPECTOMY  Patient location during evaluation: Phase II Anesthesia Type: MAC Level of consciousness: awake Pain management: pain level controlled Vital Signs Assessment: post-procedure vital signs reviewed and stable Respiratory status: spontaneous breathing and respiratory function stable Cardiovascular status: blood pressure returned to baseline and stable Postop Assessment: no headache and no apparent nausea or vomiting Anesthetic complications: no Comments: Late entry   There were no known notable events for this encounter.   Last Vitals:  Vitals:   12/08/21 0805 12/08/21 0939  BP: 136/78 108/64  Pulse: 77 82  Resp: 18 16  Temp: 36.6 C 36.4 C  SpO2: 100% 100%    Last Pain:  Vitals:   12/08/21 0939  TempSrc: Oral  PainSc: 0-No pain                 Louann Sjogren

## 2021-12-12 LAB — SURGICAL PATHOLOGY

## 2021-12-14 ENCOUNTER — Encounter (HOSPITAL_COMMUNITY): Payer: Self-pay | Admitting: Internal Medicine

## 2021-12-14 ENCOUNTER — Encounter: Payer: Self-pay | Admitting: Internal Medicine

## 2022-01-16 ENCOUNTER — Ambulatory Visit: Payer: 59 | Admitting: Family Medicine

## 2022-01-16 ENCOUNTER — Encounter: Payer: Self-pay | Admitting: Family Medicine

## 2022-01-16 VITALS — BP 132/78 | HR 86 | Temp 98.1°F | Wt 187.6 lb

## 2022-01-16 DIAGNOSIS — Z23 Encounter for immunization: Secondary | ICD-10-CM

## 2022-01-16 DIAGNOSIS — I10 Essential (primary) hypertension: Secondary | ICD-10-CM

## 2022-01-16 NOTE — Assessment & Plan Note (Signed)
Well controlled. Continue amlodipine 

## 2022-01-16 NOTE — Patient Instructions (Signed)
Follow up in 6 months.  You're doing well.  Take care  Dr. Lacinda Axon

## 2022-01-16 NOTE — Progress Notes (Signed)
Subjective:  Patient ID: Xavier Bauer, male    DOB: 07-29-1970  Age: 51 y.o. MRN: [id number]  CC: Chief Complaint  Patient presents with   Hypertension    Pt arrives to follow up on HTN. Taking med as directed. No issues. Not checking pressure at home; works from midnight to noon.     HPI:  50 year old male presents for follow-up regarding hypertension.  At last visit, patient's blood pressure was markedly elevated.  He was started amlodipine.  He states that he is tolerating his blood pressure medication well.  He takes this at night due to the fact that he works from 12 midnight to 12 AM.  He is doing well.  Requesting flu vaccine today.  Patient Active Problem List   Diagnosis Date Noted   Essential hypertension 10/16/2021   Tobacco use 10/16/2021   Alcohol use 06/28/2017   Hyperlipidemia 03/31/2012    Social Hx   Social History   Socioeconomic History   Marital status: Divorced    Spouse name: Not on file   Number of children: Not on file   Years of education: Not on file   Highest education level: Not on file  Occupational History   Not on file  Tobacco Use   Smoking status: Former    Packs/day: 0.00    Types: Cigarettes    Quit date: 06/28/2016    Years since quitting: 5.5   Smokeless tobacco: Never  Vaping Use   Vaping Use: Never used  Substance and Sexual Activity   Alcohol use: Yes    Comment: weekly    Drug use: No   Sexual activity: Yes  Other Topics Concern   Not on file  Social History Narrative   Not on file   Social Determinants of Health   Financial Resource Strain: Not on file  Food Insecurity: Not on file  Transportation Needs: Not on file  Physical Activity: Not on file  Stress: Not on file  Social Connections: Not on file    Review of Systems  Constitutional: Negative.   Respiratory: Negative.    Cardiovascular: Negative.    Objective:  BP 132/78   Pulse 86   Temp 98.1 F (36.7 C)   Wt 187 lb 9.6 oz (85.1 kg)   SpO2  99%   BMI 28.52 kg/m      01/16/2022    2:05 PM 12/08/2021    9:39 AM 12/08/2021    8:05 AM  BP/Weight  Systolic BP 448 185 631  Diastolic BP 78 64 78  Wt. (Lbs) 187.6    BMI 28.52 kg/m2      Physical Exam Vitals and nursing note reviewed.  Constitutional:      General: He is not in acute distress.    Appearance: Normal appearance.  HENT:     Head: Normocephalic and atraumatic.  Cardiovascular:     Rate and Rhythm: Normal rate and regular rhythm.  Pulmonary:     Effort: Pulmonary effort is normal.     Breath sounds: Normal breath sounds. No wheezing, rhonchi or rales.  Neurological:     Mental Status: He is alert.  Psychiatric:        Mood and Affect: Mood normal.        Behavior: Behavior normal.     Lab Results  Component Value Date   WBC 3.7 10/16/2021   HGB 15.4 10/16/2021   HCT 46.7 10/16/2021   PLT 238 10/16/2021   GLUCOSE 117 (H) 10/16/2021  CHOL 239 (H) 10/16/2021   TRIG 296 (H) 10/16/2021   HDL 54 10/16/2021   LDLCALC 132 (H) 10/16/2021   ALT 19 10/16/2021   AST 22 10/16/2021   NA 140 10/16/2021   K 5.1 10/16/2021   CL 101 10/16/2021   CREATININE 1.15 10/16/2021   BUN 14 10/16/2021   CO2 23 10/16/2021   PSA 0.8 06/28/2017   HGBA1C 5.7 (H) 10/16/2021     Assessment & Plan:   Problem List Items Addressed This Visit       Cardiovascular and Mediastinum   Essential hypertension - Primary    Well-controlled.  Continue amlodipine.      Other Visit Diagnoses     Need for vaccination       Relevant Orders   Flu Vaccine QUAD 6+ mos PF IM (Fluarix Quad PF) (Completed)       Follow-up: 6 months  Xavier Bauer Xavier Simas DO [facility]

## 2022-10-24 ENCOUNTER — Other Ambulatory Visit: Payer: Self-pay | Admitting: Family Medicine

## 2023-02-20 ENCOUNTER — Telehealth: Payer: Self-pay

## 2023-02-20 MED ORDER — AMLODIPINE BESYLATE 5 MG PO TABS: 5.0000 mg | ORAL_TABLET | Freq: Every day | ORAL | 1 refills | Status: DC

## 2023-02-20 NOTE — Telephone Encounter (Signed)
Prescription Request  02/20/2023  LOV: Visit date not found  What is the name of the medication or equipment? amLODipine (NORVASC) 5 MG tablet   Have you contacted your pharmacy to request a refill? Yes   Which pharmacy would you like this sent to?  [facility] - [place], [place] - [address] [place] [address] Phone: [phone] Fax: [phone]    Patient notified that their request is being sent to the clinical staff for review and that they should receive a response within 2 business days.   Please advise at Mobile [phone] (mobile)

## 2023-04-01 ENCOUNTER — Other Ambulatory Visit: Payer: Self-pay | Admitting: Family Medicine

## 2023-04-12 ENCOUNTER — Encounter: Payer: 59 | Admitting: Family Medicine

## 2023-04-23 ENCOUNTER — Ambulatory Visit: Payer: 59 | Admitting: Family Medicine

## 2023-04-23 VITALS — BP 139/86 | Ht 69.0 in | Wt 187.2 lb

## 2023-04-23 DIAGNOSIS — E782 Mixed hyperlipidemia: Secondary | ICD-10-CM

## 2023-04-23 DIAGNOSIS — Z0001 Encounter for general adult medical examination with abnormal findings: Secondary | ICD-10-CM | POA: Diagnosis not present

## 2023-04-23 DIAGNOSIS — Z23 Encounter for immunization: Secondary | ICD-10-CM

## 2023-04-23 DIAGNOSIS — R7303 Prediabetes: Secondary | ICD-10-CM

## 2023-04-23 DIAGNOSIS — Z125 Encounter for screening for malignant neoplasm of prostate: Secondary | ICD-10-CM

## 2023-04-23 DIAGNOSIS — Z130 Encounter for screening for diseases of the blood and blood-forming organs and certain disorders involving the immune mechanism: Secondary | ICD-10-CM

## 2023-04-23 DIAGNOSIS — I10 Essential (primary) hypertension: Secondary | ICD-10-CM | POA: Diagnosis not present

## 2023-04-23 DIAGNOSIS — Z0000 Encounter for general adult medical examination without abnormal findings: Secondary | ICD-10-CM | POA: Insufficient documentation

## 2023-04-23 MED ORDER — VARENICLINE TARTRATE 1 MG PO TABS: 1.0000 mg | ORAL_TABLET | Freq: Two times a day (BID) | ORAL | 3 refills | Status: AC

## 2023-04-23 MED ORDER — VARENICLINE TARTRATE (STARTER) 0.5 MG X 11 & 1 MG X 42 PO TBPK
ORAL_TABLET | ORAL | 0 refills | Status: AC
Start: 2023-04-23 — End: ?

## 2023-04-23 NOTE — Assessment & Plan Note (Signed)
 Doing well.  Hypertension stable.  Labs today.  Discussed smoking cessation.  Proceeding with Chantix. Flu vaccine given.

## 2023-04-23 NOTE — Progress Notes (Signed)
 Subjective:  Patient ID: Xavier Bauer, male    DOB: 01/18/1971  Age: 53 y.o. MRN: [id number]  CC:   Chief Complaint  Patient presents with   Annual Exam    No problems or concerns    HPI:  53 year old male with hypertension, hyperlipidemia, tobacco abuse presents for follow-up.  Patient in need of flu vaccine today.  He is amenable to this.  He states that overall he is doing well.  Needs labs today.  Hypertension is stable.  Will discuss smoking cessation today.  Smokes 5 to 6 cigarettes a day.  Works at [employer].  Patient Active Problem List   Diagnosis Date Noted   Annual physical exam 04/23/2023   Essential hypertension 10/16/2021   Tobacco use 10/16/2021   Alcohol use 06/28/2017   Hyperlipidemia 03/31/2012    Social Hx   Social History   Socioeconomic History   Marital status: Divorced    Spouse name: Not on file   Number of children: Not on file   Years of education: Not on file   Highest education level: 12th grade  Occupational History   Not on file  Tobacco Use   Smoking status: Former    Current packs/day: 0.00    Types: Cigarettes    Quit date: 06/28/2016    Years since quitting: 6.8   Smokeless tobacco: Never  Vaping Use   Vaping status: Never Used  Substance and Sexual Activity   Alcohol use: Yes    Comment: weekly    Drug use: No   Sexual activity: Yes  Other Topics Concern   Not on file  Social History Narrative   Not on file   Social Drivers of Health   Financial Resource Strain: Low Risk  (04/19/2023)   Overall Financial Resource Strain (CARDIA)    Difficulty of Paying Living Expenses: Not hard at all  Food Insecurity: No Food Insecurity (04/19/2023)   Hunger Vital Sign    Worried About Running Out of Food in the Last Year: Never true    Ran Out of Food in the Last Year: Never true  Transportation Needs: No Transportation Needs (04/19/2023)   PRAPARE - Administrator, Civil Service (Medical): No    Lack of  Transportation (Non-Medical): No  Physical Activity: Insufficiently Active (04/19/2023)   Exercise Vital Sign    Days of Exercise per Week: 1 day    Minutes of Exercise per Session: 20 min  Stress: No Stress Concern Present (04/19/2023)   [employer] of Occupational Health - Occupational Stress Questionnaire    Feeling of Stress : Not at all  Social Connections: Moderately Integrated (04/19/2023)   Social Connection and Isolation Panel [NHANES]    Frequency of Communication with Friends and Family: More than three times a week    Frequency of Social Gatherings with Friends and Family: Once a week    Attends Religious Services: More than 4 times per year    Active Member of [employer] or Organizations: Yes    Attends Engineer, Structural: More than 4 times per year    Marital Status: Divorced    Review of Systems  Respiratory: Negative.    Cardiovascular: Negative.    Objective:  BP 139/86   Ht 5' 9 (1.753 m)   Wt 187 lb 3.2 oz (84.9 kg)   BMI 27.64 kg/m      04/23/2023    1:17 PM 01/16/2022    2:05 PM 12/08/2021    9:39  AM  BP/Weight  Systolic BP 139 132 108  Diastolic BP 86 78 64  Wt. (Lbs) 187.2 187.6   BMI 27.64 kg/m2 28.52 kg/m2     Physical Exam Vitals and nursing note reviewed.  Constitutional:      General: He is not in acute distress.    Appearance: Normal appearance.  HENT:     Head: Normocephalic and atraumatic.  Eyes:     General:        Right eye: No discharge.        Left eye: No discharge.     Conjunctiva/sclera: Conjunctivae normal.  Cardiovascular:     Rate and Rhythm: Normal rate and regular rhythm.  Pulmonary:     Effort: Pulmonary effort is normal.     Breath sounds: Normal breath sounds. No wheezing, rhonchi or rales.  Neurological:     Mental Status: He is alert.  Psychiatric:        Mood and Affect: Mood normal.        Behavior: Behavior normal.     Lab Results  Component Value Date   WBC 3.7 10/16/2021   HGB 15.4 10/16/2021    HCT 46.7 10/16/2021   PLT 238 10/16/2021   GLUCOSE 117 (H) 10/16/2021   CHOL 239 (H) 10/16/2021   TRIG 296 (H) 10/16/2021   HDL 54 10/16/2021   LDLCALC 132 (H) 10/16/2021   ALT 19 10/16/2021   AST 22 10/16/2021   NA 140 10/16/2021   K 5.1 10/16/2021   CL 101 10/16/2021   CREATININE 1.15 10/16/2021   BUN 14 10/16/2021   CO2 23 10/16/2021   PSA 0.8 06/28/2017   HGBA1C 5.7 (H) 10/16/2021     Assessment & Plan:   Problem List Items Addressed This Visit       Cardiovascular and Mediastinum   Essential hypertension   Relevant Orders   CMP14+EGFR   Microalbumin / creatinine urine ratio     Other   Hyperlipidemia   Relevant Orders   Lipid panel   Annual physical exam - Primary   Doing well.  Hypertension stable.  Labs today.  Discussed smoking cessation.  Proceeding with Chantix . Flu vaccine given.      Other Visit Diagnoses       Prediabetes       Relevant Orders   Hemoglobin A1c     Screening for deficiency anemia       Relevant Orders   CBC     Prostate cancer screening       Relevant Orders   PSA     Need for vaccination       Relevant Orders   Flu vaccine trivalent PF, 6mos and older(Flulaval,Afluria,Fluarix,Fluzone) (Completed)       Meds ordered this encounter  Medications   Varenicline  Tartrate, Starter, (CHANTIX  STARTING MONTH PAK) 0.5 MG X 11 & 1 MG X 42 TBPK    Sig: Days 1 to 3: 0.5 mg once daily. Days 4 to 7: 0.5 mg twice daily. Maintenance (day 8 and later): 1 mg twice daily.    Dispense:  53 each    Refill:  0   varenicline  (CHANTIX  CONTINUING MONTH PAK) 1 MG tablet    Sig: Take 1 tablet (1 mg total) by mouth 2 (two) times daily.    Dispense:  60 tablet    Refill:  3    Follow-up:  Return in about 1 year (around 04/22/2024).  Jacqulyn Ahle DO [facility]

## 2023-04-23 NOTE — Patient Instructions (Signed)
 Labs today.  Work on smoking cessation.  Follow up annually.

## 2023-04-24 LAB — CMP14+EGFR
ALT: 17 [IU]/L (ref 0–44)
AST: 18 [IU]/L (ref 0–40)
Albumin: 4.6 g/dL (ref 3.8–4.9)
Alkaline Phosphatase: 79 [IU]/L (ref 44–121)
BUN/Creatinine Ratio: 10 (ref 9–20)
BUN: 9 mg/dL (ref 6–24)
Bilirubin Total: 0.5 mg/dL (ref 0.0–1.2)
CO2: 25 mmol/L (ref 20–29)
Calcium: 9.5 mg/dL (ref 8.7–10.2)
Chloride: 102 mmol/L (ref 96–106)
Creatinine, Ser: 0.92 mg/dL (ref 0.76–1.27)
Globulin, Total: 2.8 g/dL (ref 1.5–4.5)
Glucose: 83 mg/dL (ref 70–99)
Potassium: 4.3 mmol/L (ref 3.5–5.2)
Sodium: 141 mmol/L (ref 134–144)
Total Protein: 7.4 g/dL (ref 6.0–8.5)
eGFR: 99 mL/min/{1.73_m2} (ref >59)

## 2023-04-24 LAB — HEMOGLOBIN A1C
Est. average glucose Bld gHb Est-mCnc: 111 mg/dL
Hgb A1c MFr Bld: 5.5 % (ref 4.8–5.6)

## 2023-04-24 LAB — LIPID PANEL
Chol/HDL Ratio: 4.5 {ratio} (ref 0.0–5.0)
Cholesterol, Total: 246 mg/dL — ABNORMAL HIGH (ref 100–199)
HDL: 55 mg/dL (ref >39)
LDL Chol Calc (NIH): 151 mg/dL — ABNORMAL HIGH (ref 0–99)
Triglycerides: 222 mg/dL — ABNORMAL HIGH (ref 0–149)
VLDL Cholesterol Cal: 40 mg/dL (ref 5–40)

## 2023-04-24 LAB — CBC
Hematocrit: 42.3 % (ref 37.5–51.0)
Hemoglobin: 13.6 g/dL (ref 13.0–17.7)
MCH: 27.5 pg (ref 26.6–33.0)
MCHC: 32.2 g/dL (ref 31.5–35.7)
MCV: 86 fL (ref 79–97)
Platelets: 253 10*3/uL (ref 150–450)
RBC: 4.94 x10E6/uL (ref 4.14–5.80)
RDW: 12.6 % (ref 11.6–15.4)
WBC: 7.2 10*3/uL (ref 3.4–10.8)

## 2023-04-24 LAB — PSA: Prostate Specific Ag, Serum: 1.2 ng/mL (ref 0.0–4.0)

## 2023-04-24 LAB — MICROALBUMIN / CREATININE URINE RATIO
Creatinine, Urine: 188 mg/dL
Microalb/Creat Ratio: 4 mg/g{creat} (ref 0–29)
Microalbumin, Urine: 6.6 ug/mL

## 2023-04-26 ENCOUNTER — Other Ambulatory Visit: Payer: Self-pay

## 2023-04-26 MED ORDER — ROSUVASTATIN CALCIUM 10 MG PO TABS: 10.0000 mg | ORAL_TABLET | Freq: Every day | ORAL | 3 refills | Status: AC

## 2023-09-12 ENCOUNTER — Other Ambulatory Visit: Payer: Self-pay | Admitting: Family Medicine

## 2024-04-19 ENCOUNTER — Other Ambulatory Visit: Payer: Self-pay | Admitting: Family Medicine

## 2024-04-23 ENCOUNTER — Encounter: Payer: Self-pay | Admitting: Family Medicine

## 2024-04-23 ENCOUNTER — Ambulatory Visit: Admitting: Family Medicine

## 2024-04-23 VITALS — BP 150/98 | HR 61 | Temp 98.4°F | Ht 69.0 in | Wt 186.0 lb

## 2024-04-23 DIAGNOSIS — Z125 Encounter for screening for malignant neoplasm of prostate: Secondary | ICD-10-CM

## 2024-04-23 DIAGNOSIS — I10 Essential (primary) hypertension: Secondary | ICD-10-CM | POA: Diagnosis not present

## 2024-04-23 DIAGNOSIS — E782 Mixed hyperlipidemia: Secondary | ICD-10-CM

## 2024-04-23 DIAGNOSIS — Z0000 Encounter for general adult medical examination without abnormal findings: Secondary | ICD-10-CM | POA: Diagnosis not present

## 2024-04-23 DIAGNOSIS — M25561 Pain in right knee: Secondary | ICD-10-CM | POA: Diagnosis not present

## 2024-04-23 MED ORDER — MELOXICAM 15 MG PO TABS: 15.0000 mg | ORAL_TABLET | Freq: Every day | ORAL | 0 refills | Status: AC | PRN

## 2024-04-23 NOTE — Progress Notes (Signed)
 "  Subjective:  Patient ID: Xavier Bauer, male    DOB: [birth year]  Age: 54 y.o. MRN: [id number]  CC:   Chief Complaint  Patient presents with   Edema    Swelling in right knee down to ankle x 2 weeks. Was climbing a ladder painting the day before it started. Hurts to walk, lay down and sit. No OTC medication taken.    HPI:  54 year old male presents for an annual exam. Also, having right knee pain.  Declines immunizations today.  Colonoscopy up to date. Has cut back on alcohol intake and is smoking very little.  Needs routine labs today. Will update health care maintenance section today.  Reports 2 week history of right knee pain. Started after painting and going up and down a ladder frequently. Reports associated swelling. Pain 5/10 in severity. No medications tried. No relieving factors. Does not recall an injury.  Patient Active Problem List   Diagnosis Date Noted   Acute pain of right knee 04/23/2024   Annual physical exam 04/23/2023   Essential hypertension 10/16/2021   Tobacco use 10/16/2021   Alcohol use 06/28/2017   Hyperlipidemia 03/31/2012    Social Hx   Social History   Socioeconomic History   Marital status: Divorced    Spouse name: Not on file   Number of children: Not on file   Years of education: Not on file   Highest education level: GED or equivalent  Occupational History   Not on file  Tobacco Use   Smoking status: Former    Current packs/day: 0.00    Types: Cigarettes    Quit date: 06/28/2016    Years since quitting: 7.8   Smokeless tobacco: Never  Vaping Use   Vaping status: Never Used  Substance and Sexual Activity   Alcohol use: Yes    Comment: weekly    Drug use: No   Sexual activity: Yes  Other Topics Concern   Not on file  Social History Narrative   Not on file   Social Drivers of Health   Tobacco Use: Medium Risk (04/22/2024)   Patient History    Smoking Tobacco Use: Former    Smokeless Tobacco Use: Never    Passive Exposure: Not  on Actuary Strain: Low Risk (04/22/2024)   Overall Financial Resource Strain (CARDIA)    Difficulty of Paying Living Expenses: Not hard at all  Food Insecurity: No Food Insecurity (04/22/2024)   Epic    Worried About Radiation Protection Practitioner of Food in the Last Year: Never true    Ran Out of Food in the Last Year: Never true  Transportation Needs: No Transportation Needs (04/22/2024)   Epic    Lack of Transportation (Medical): No    Lack of Transportation (Non-Medical): No  Physical Activity: Inactive (04/22/2024)   Exercise Vital Sign    Days of Exercise per Week: 0 days    Minutes of Exercise per Session: Not on file  Stress: No Stress Concern Present (04/22/2024)   [employer] of Occupational Health - Occupational Stress Questionnaire    Feeling of Stress: Not at all  Social Connections: Moderately Integrated (04/22/2024)   Social Connection and Isolation Panel    Frequency of Communication with Friends and Family: More than three times a week    Frequency of Social Gatherings with Friends and Family: Once a week    Attends Religious Services: More than 4 times per year    Active Member of [employer] or Organizations:  Yes    Attends Club or Organization Meetings: More than 4 times per year    Marital Status: Divorced  Depression (PHQ2-9): Medium Risk (04/23/2024)   Depression (PHQ2-9)    PHQ-2 Score: 6  Alcohol Screen: Low Risk (04/22/2024)   Alcohol Screen    Last Alcohol Screening Score (AUDIT): 4  Housing: Low Risk (04/22/2024)   Epic    Unable to Pay for Housing in the Last Year: No    Number of Times Moved in the Last Year: 0    Homeless in the Last Year: No  Utilities: Not on file  Health Literacy: Not on file    Review of Systems Per HPI  Objective:  BP (!) 150/98   Pulse 61   Temp 98.4 F (36.9 C)   Ht 5' 9 (1.753 m)   Wt 186 lb (84.4 kg)   SpO2 100%   BMI 27.47 kg/m      04/23/2024   11:01 AM 04/23/2024   10:22 AM 04/23/2023    1:17 PM  BP/Weight  Systolic BP  150 848 139  Diastolic BP 98 83 86  Wt. (Lbs)  186 187.2  BMI  27.47 kg/m2 27.64 kg/m2    Physical Exam Vitals and nursing note reviewed.  Constitutional:      General: He is not in acute distress.    Appearance: Normal appearance.  HENT:     Head: Normocephalic and atraumatic.  Eyes:     General:        Right eye: No discharge.        Left eye: No discharge.     Conjunctiva/sclera: Conjunctivae normal.  Cardiovascular:     Rate and Rhythm: Normal rate and regular rhythm.  Pulmonary:     Effort: Pulmonary effort is normal.     Breath sounds: Normal breath sounds. No wheezing, rhonchi or rales.  Musculoskeletal:     Comments: Right knee - no joint line tenderness. No appreciable effusions. No ligamentous laxity. No crepitus.  Neurological:     Mental Status: He is alert.  Psychiatric:        Mood and Affect: Mood normal.        Behavior: Behavior normal.     Lab Results  Component Value Date   WBC 7.2 04/23/2023   HGB 13.6 04/23/2023   HCT 42.3 04/23/2023   PLT 253 04/23/2023   GLUCOSE 83 04/23/2023   CHOL 246 (H) 04/23/2023   TRIG 222 (H) 04/23/2023   HDL 55 04/23/2023   LDLCALC 151 (H) 04/23/2023   ALT 17 04/23/2023   AST 18 04/23/2023   NA 141 04/23/2023   K 4.3 04/23/2023   CL 102 04/23/2023   CREATININE 0.92 04/23/2023   BUN 9 04/23/2023   CO2 25 04/23/2023   PSA 0.8 06/28/2017   HGBA1C 5.5 04/23/2023     Assessment & Plan:  Annual physical exam Assessment & Plan: Health care maintenance updated today.  Advised complete smoking cessation. Labs ordered.   Mixed hyperlipidemia -     Lipid panel  Essential hypertension Assessment & Plan: BP elevated here today. He forgot to take his medication.  Orders: -     CMP14+EGFR -     Microalbumin / creatinine urine ratio  Screening PSA (prostate specific antigen) -     PSA  Acute pain of right knee Assessment & Plan: Benign exam. Xray for further evaluation.  Mobic  as directed.  Orders: -      Meloxicam ; Take 1 tablet (  15 mg total) by mouth daily as needed for pain.  Dispense: 30 tablet; Refill: 0 -     DG Knee Complete 4 Views Right    Follow-up:  6 months  Reginaldo Hazard Bluford DO [facility] "

## 2024-04-23 NOTE — Assessment & Plan Note (Signed)
 Health care maintenance updated today.  Advised complete smoking cessation. Labs ordered.

## 2024-04-23 NOTE — Assessment & Plan Note (Signed)
 BP elevated here today. He forgot to take his medication.

## 2024-04-23 NOTE — Assessment & Plan Note (Signed)
 Benign exam. Xray for further evaluation.  Mobic  as directed.

## 2024-04-23 NOTE — Patient Instructions (Signed)
 Medication as directed.  Labs ordered.  Follow up in 6 months.

## 2024-04-24 LAB — CMP14+EGFR
ALT: 24 IU/L (ref 0–44)
AST: 21 IU/L (ref 0–40)
Albumin: 4.6 g/dL (ref 3.8–4.9)
Alkaline Phosphatase: 81 IU/L (ref 47–123)
BUN/Creatinine Ratio: 18 (ref 9–20)
BUN: 18 mg/dL (ref 6–24)
Bilirubin Total: 0.4 mg/dL (ref 0.0–1.2)
CO2: 24 mmol/L (ref 20–29)
Calcium: 9.7 mg/dL (ref 8.7–10.2)
Chloride: 100 mmol/L (ref 96–106)
Creatinine, Ser: 0.98 mg/dL (ref 0.76–1.27)
Globulin, Total: 3 g/dL (ref 1.5–4.5)
Glucose: 103 mg/dL — ABNORMAL HIGH (ref 70–99)
Potassium: 4.6 mmol/L (ref 3.5–5.2)
Sodium: 140 mmol/L (ref 134–144)
Total Protein: 7.6 g/dL (ref 6.0–8.5)
eGFR: 92 mL/min/1.73

## 2024-04-24 LAB — MICROALBUMIN / CREATININE URINE RATIO
Creatinine, Urine: 184.5 mg/dL
Microalb/Creat Ratio: 3 mg/g{creat} (ref 0–29)
Microalbumin, Urine: 6.1 ug/mL

## 2024-04-24 LAB — LIPID PANEL
Chol/HDL Ratio: 3.7 ratio (ref 0.0–5.0)
Cholesterol, Total: 192 mg/dL (ref 100–199)
HDL: 52 mg/dL
LDL Chol Calc (NIH): 70 mg/dL (ref 0–99)
Triglycerides: 447 mg/dL — ABNORMAL HIGH (ref 0–149)
VLDL Cholesterol Cal: 70 mg/dL — ABNORMAL HIGH (ref 5–40)

## 2024-04-24 LAB — PSA: Prostate Specific Ag, Serum: 1.2 ng/mL (ref 0.0–4.0)

## 2024-04-26 ENCOUNTER — Ambulatory Visit: Payer: Self-pay | Admitting: Family Medicine

## 2024-05-04 ENCOUNTER — Ambulatory Visit (HOSPITAL_COMMUNITY)
Admission: RE | Admit: 2024-05-04 | Discharge: 2024-05-04 | Disposition: A | Discharge status: Home / Self Care | Source: Ambulatory Visit | Attending: Family Medicine | Admitting: Family Medicine

## 2024-05-04 DIAGNOSIS — M25561 Pain in right knee: Secondary | ICD-10-CM | POA: Diagnosis present
